# Patient Record
Sex: Male | Born: 1935 | Race: White | Hispanic: No | Marital: Married | Smoking: Former smoker
Health system: Southern US, Community
[De-identification: ages and names within clinical notes are randomized; demographics above are authoritative.]

## PROBLEM LIST (undated history)

## (undated) DIAGNOSIS — E119 Type 2 diabetes mellitus without complications: Secondary | ICD-10-CM

## (undated) DIAGNOSIS — M1990 Unspecified osteoarthritis, unspecified site: Secondary | ICD-10-CM

## (undated) DIAGNOSIS — I10 Essential (primary) hypertension: Secondary | ICD-10-CM

## (undated) DIAGNOSIS — Z8679 Personal history of other diseases of the circulatory system: Secondary | ICD-10-CM

## (undated) DIAGNOSIS — E785 Hyperlipidemia, unspecified: Secondary | ICD-10-CM

## (undated) DIAGNOSIS — J449 Chronic obstructive pulmonary disease, unspecified: Secondary | ICD-10-CM

## (undated) DIAGNOSIS — Z87438 Personal history of other diseases of male genital organs: Secondary | ICD-10-CM

## (undated) DIAGNOSIS — K760 Fatty (change of) liver, not elsewhere classified: Secondary | ICD-10-CM

## (undated) DIAGNOSIS — J309 Allergic rhinitis, unspecified: Secondary | ICD-10-CM

## (undated) DIAGNOSIS — K635 Polyp of colon: Secondary | ICD-10-CM

## (undated) DIAGNOSIS — I4510 Unspecified right bundle-branch block: Secondary | ICD-10-CM

## (undated) DIAGNOSIS — I2510 Atherosclerotic heart disease of native coronary artery without angina pectoris: Secondary | ICD-10-CM

## (undated) DIAGNOSIS — N200 Calculus of kidney: Secondary | ICD-10-CM

## (undated) HISTORY — DX: Essential (primary) hypertension: I10

## (undated) HISTORY — DX: Unspecified right bundle-branch block: I45.10

## (undated) HISTORY — PX: HEMICOLECTOMY: SHX854

## (undated) HISTORY — DX: Unspecified osteoarthritis, unspecified site: M19.90

## (undated) HISTORY — DX: Personal history of other diseases of the circulatory system: Z86.79

## (undated) HISTORY — DX: Personal history of other diseases of male genital organs: Z87.438

## (undated) HISTORY — DX: Fatty (change of) liver, not elsewhere classified: K76.0

## (undated) HISTORY — DX: Type 2 diabetes mellitus without complications: E11.9

## (undated) HISTORY — DX: Allergic rhinitis, unspecified: J30.9

## (undated) HISTORY — DX: Calculus of kidney: N20.0

## (undated) HISTORY — DX: Atherosclerotic heart disease of native coronary artery without angina pectoris: I25.10

## (undated) HISTORY — PX: COLONOSCOPY: SHX174

## (undated) HISTORY — DX: Chronic obstructive pulmonary disease, unspecified: J44.9

## (undated) HISTORY — DX: Polyp of colon: K63.5

## (undated) HISTORY — DX: Hyperlipidemia, unspecified: E78.5

---

## 2004-02-01 ENCOUNTER — Inpatient Hospital Stay (HOSPITAL_BASED_OUTPATIENT_CLINIC_OR_DEPARTMENT_OTHER): Admission: RE | Admit: 2004-02-01 | Discharge: 2004-02-01 | Payer: Self-pay | Admitting: Cardiology

## 2008-12-20 ENCOUNTER — Ambulatory Visit: Payer: Self-pay | Admitting: Vascular Surgery

## 2008-12-20 ENCOUNTER — Encounter: Payer: Self-pay | Admitting: Cardiology

## 2009-06-06 ENCOUNTER — Ambulatory Visit: Payer: Self-pay | Admitting: Vascular Surgery

## 2009-12-10 ENCOUNTER — Ambulatory Visit: Payer: Self-pay | Admitting: Vascular Surgery

## 2010-09-10 NOTE — Assessment & Plan Note (Signed)
OFFICE VISIT   Ryan [name]  DOB:  12/03/1932                                       12/20/2008  [id number]#:[id number]   The patient is a 77 year old male referred by Dr. Sherryll Burger for complaints of  bilateral lower extremity claudication.  He currently experiences a  tightening sensation in both calves after walking approximately 25-30  yards.  He also becomes short of breath at this point.  He denies any  history of chest pain.  He states symptoms have been fairly chronic but  slowly worse over the last 340 years.  He has not had any history of  nonhealing ulcers on the feet or rest pain.   Atherosclerotic risk factors include diabetes, hypertension and elevated  cholesterol.   PAST SURGICAL HISTORY:  None.   PAST MEDICAL HISTORY:  Enlarged liver.   SOCIAL HISTORY:  He is married.  He is a former two pack per day smoker,  but quit approximately 25 years ago.   REVIEW OF SYSTEMS:  CARDIAC:  He gets shortness of breath with exertion.  GENERAL:  Denies recent weight loss or gain.  GI:  Has a history of NASH.  RENAL:  He has some urinary frequency and history of kidney stones.  VASCULAR, NEURO, ORTHOPEDIC PSYCHIATRIC, ENT, HEMATOLOGIC, PULMONARY:  All negative.   MEDICATIONS:  1. Diltiazem ER 180 one daily.  2. Chlorthalidone 25 mg once daily.  3. Simvastatin 40 mg once daily.  4. Lisinopril 40 mg once daily.  5. Potassium chloride Micro ER 10 mEq once daily.  6. Atenolol 100 mg once a day.  7. Metformin 850 mg two daily.  8. Actos 15 mg once daily.  9. Aspirin 81 mg once daily.  10.Fish oil 1200 on mg once daily.  11.Cilostazol 100 mg which he just started.   PAST SURGICAL HISTORY:  None.   PHYSICAL EXAM:  Vital signs:  Blood pressure 172/70 in the left arm,  pulse is 73 and regular.  HEENT is unremarkable.  Neck:  Has 2+ carotid  pulses. Without bruit.  Chest:  Clear to auscultation.  Chest has  actually decreased breath sounds in the right lung  field.  Left chest is  clear to auscultation.  Heart:  Regular rate rhythm without murmur.  Abdomen:  Protuberant, soft, nontender, nondistended with palpable liver  approximately 3 cm below the right costal margin, no masses.  Extremities:  He has 2+ radial and femoral pulses bilaterally.  He has  3+ right popliteal pulse.  He has absent left popliteal pulse.  He has  2+ dorsalis pedis pulses bilaterally.  Feet are pink, warm and well-  perfused.   He had bilateral ABIs performed at [facility] on November 20, 2008.  This included ABIs with arterial duplex.  This showed calcified vessels  with occlusion of the posterior tibial artery and the left superficial  femoral artery.  He had bilateral ABIs performed at our office today  which showed biphasic flow in the dorsalis pedis artery bilaterally with  an ABI on the right side of 1.01, on the left of 0.75.   I had a lengthy discussion with the patient today concerning his  claudication symptoms.  He has relatively normal ABIs on the right side.  Although he does have some evidence on previous duplex of some tibial  artery occlusive disease, he  does have palpable pulses in his feet  bilaterally.  This should be adequate circulation.  His ABIs slightly  decreased on the left side and again this is probably due to superficial  femoral artery occlusive disease.  I believe the best management for  right now is Pletal as Dr. Sherryll Burger has already prescribed.  In addition, he  will walk for 30 minutes daily.  He also continue risk factor  modification of his diabetes, cholesterol and hypertension.   Additionally, since he had decreased breath sounds on the right side and  also has been developing shortness of breath at a very short walking  distance, I ordered a PA and lateral chest x-ray for him to evaluate his  dyspnea.  If this appears normal I may consider sending him back to Dr.  Sherryll Burger or Dr. Myrtis Ser for further evaluation of his dyspnea  symptoms.   The patient will follow up with me in six months' time for repeat ABIs.   Ryan Hora. Fields, MD  Electronically Signed   CEF/MEDQ  D:  12/20/2008  T:  12/21/2008  Job:  [id number]   cc:   Dr. Storm[facility]  Dr. Kirstie Peri

## 2010-09-13 NOTE — Cardiovascular Report (Signed)
NAMEJONUEL, BUTTERFIELD NO.:  [id number]   MEDICAL RECORD NO.:  [id number]          PATIENT TYPE:  OIB   LOCATION:  6501                         FACILITY:  [facility]   PHYSICIAN:  Rollene Rotunda, M.D.   DATE OF BIRTH:  03-15-1933   DATE OF PROCEDURE:  02/01/2004  DATE OF DISCHARGE:                              CARDIAC CATHETERIZATION   PRIMARY:  Dr. Wende Crease.   PROCEDURE:  Left heart catheterization/coronary arteriography.   INDICATION:  Evaluate patient with chest pain and a Cardiolite suggesting  possible lateral wall ischemia with a moderate defect in the inferior  inferolateral wall and a moderate anterior defect.   PROCEDURAL NOTE:  Left heart catheterization was performed via the right  femoral artery.  The artery was cannulated using an anterior wall puncture.  A #4-French arterial sheath was inserted via the modified Seldinger  technique.  Preformed Judkins and a pigtail catheter were utilized.  The  patient tolerated the procedure well and left the lab in stable condition.   RESULTS:   HEMODYNAMICS:  LV 179/32, AO 84/45.   CORONARIES:  The left main had 25% stenosis.   The LAD had a proximal 30% stenosis and mid long 25% stenosis.  There were  diffuse luminal irregularities.  The first diagonal was moderate-sized and  normal.  The second diagonal and third diagonal were small.   The circumflex in the AV groove had a 60% stenosis before a large obtuse  marginal.  I did inject intracoronary nitroglycerin and this did not appear  to be more severe than that after the injection of this.  There was a ramus  intermedius which was small-to-moderate-sized with ostial 30% to 40%  stenosis.  An OM-1 was large with luminal irregularities.  The OM-2 was  normal after the 60% lesion.   The right coronary artery is a large dominant vessel.  There were diffuse  25% lesions in the proximal and mid-body.   The PDA was moderate-sized with diffuse luminal  irregularities.  Posterolateral 1 and posterolateral 2 were small.   LEFT VENTRICULOGRAM:  The left ventriculogram was obtained in the RAO  projection.  The ER was 60% with well-preserved wall motion.   CONCLUSION:  Non-obstructive coronary disease.  Well-preserved ejection  fraction.   PLAN:  The patient will continue to have medical management and risk  reduction.  I have discussed this with Dr. Willa Rough, who will review the  films further and see the patient back in consultation.       JH/MEDQ  D:  02/01/2004  T:  02/01/2004  Job:  [id number]   cc:   Wende Crease M.D.   Jonita Albee, [facility]

## 2011-10-24 ENCOUNTER — Encounter: Payer: Self-pay | Admitting: Cardiology

## 2011-10-28 ENCOUNTER — Encounter: Payer: Self-pay | Admitting: Cardiology

## 2011-10-28 ENCOUNTER — Ambulatory Visit (INDEPENDENT_AMBULATORY_CARE_PROVIDER_SITE_OTHER): Payer: Medicare Other | Admitting: Cardiology

## 2011-10-28 VITALS — BP 154/77 | HR 79 | Ht 72.0 in | Wt 193.6 lb

## 2011-10-28 DIAGNOSIS — I313 Pericardial effusion (noninflammatory): Secondary | ICD-10-CM

## 2011-10-28 DIAGNOSIS — K760 Fatty (change of) liver, not elsewhere classified: Secondary | ICD-10-CM

## 2011-10-28 DIAGNOSIS — I358 Other nonrheumatic aortic valve disorders: Secondary | ICD-10-CM | POA: Insufficient documentation

## 2011-10-28 DIAGNOSIS — E119 Type 2 diabetes mellitus without complications: Secondary | ICD-10-CM

## 2011-10-28 DIAGNOSIS — I319 Disease of pericardium, unspecified: Secondary | ICD-10-CM

## 2011-10-28 DIAGNOSIS — I10 Essential (primary) hypertension: Secondary | ICD-10-CM | POA: Insufficient documentation

## 2011-10-28 DIAGNOSIS — R0989 Other specified symptoms and signs involving the circulatory and respiratory systems: Secondary | ICD-10-CM

## 2011-10-28 DIAGNOSIS — I359 Nonrheumatic aortic valve disorder, unspecified: Secondary | ICD-10-CM

## 2011-10-28 DIAGNOSIS — I3139 Other pericardial effusion (noninflammatory): Secondary | ICD-10-CM

## 2011-10-28 DIAGNOSIS — I340 Nonrheumatic mitral (valve) insufficiency: Secondary | ICD-10-CM

## 2011-10-28 DIAGNOSIS — IMO0002 Reserved for concepts with insufficient information to code with codable children: Secondary | ICD-10-CM

## 2011-10-28 DIAGNOSIS — J449 Chronic obstructive pulmonary disease, unspecified: Secondary | ICD-10-CM | POA: Insufficient documentation

## 2011-10-28 DIAGNOSIS — I509 Heart failure, unspecified: Secondary | ICD-10-CM | POA: Insufficient documentation

## 2011-10-28 DIAGNOSIS — K7689 Other specified diseases of liver: Secondary | ICD-10-CM

## 2011-10-28 DIAGNOSIS — E785 Hyperlipidemia, unspecified: Secondary | ICD-10-CM

## 2011-10-28 DIAGNOSIS — I4510 Unspecified right bundle-branch block: Secondary | ICD-10-CM

## 2011-10-28 DIAGNOSIS — R0602 Shortness of breath: Secondary | ICD-10-CM | POA: Insufficient documentation

## 2011-10-28 DIAGNOSIS — R9430 Abnormal result of cardiovascular function study, unspecified: Secondary | ICD-10-CM | POA: Insufficient documentation

## 2011-10-28 DIAGNOSIS — I059 Rheumatic mitral valve disease, unspecified: Secondary | ICD-10-CM

## 2011-10-28 DIAGNOSIS — I2510 Atherosclerotic heart disease of native coronary artery without angina pectoris: Secondary | ICD-10-CM

## 2011-10-28 MED ORDER — FUROSEMIDE 40 MG PO TABS: 40.0000 mg | ORAL_TABLET | Freq: Every day | ORAL | Status: DC

## 2011-10-28 MED ORDER — AMLODIPINE BESYLATE 5 MG PO TABS: 5.0000 mg | ORAL_TABLET | Freq: Every day | ORAL | Status: DC

## 2011-10-28 NOTE — Assessment & Plan Note (Signed)
The patient is on several medications for his blood pressure. It is slightly elevated today. As we change him from diltiazem to amlodipine we will probably get even better systolic blood pressure control.

## 2011-10-28 NOTE — Assessment & Plan Note (Signed)
In the past his ejection fraction was 60% as late as an echo in September, 2010. Ejection fraction was 45-50% by echo May, 2013. There were no definite focal wall motion abnormalities. Will have to rule out ischemic disease. Currently he is on an ACE inhibitor and a beta blocker. He is on diltiazem. This will be stopped and changed to amlodipine as these medicines are also used for his blood pressure. I will continue to titrate his medicines over time. I will probably change his atenolol to carvedilol at a later date.

## 2011-10-28 NOTE — Assessment & Plan Note (Signed)
At this point his shortness of breath is multifactorial. There seems to be a new additional component in the past several weeks. I suspect that volume is playing a role along with his underlying lung disease.

## 2011-10-28 NOTE — Assessment & Plan Note (Signed)
There is mild mitral regurgitation by history. This will be followed.

## 2011-10-28 NOTE — Assessment & Plan Note (Addendum)
The patient appears to have some CHF. He has had pleural effusions in the past and his most recent chest x-ray did show pleural fluid. Since that time there has been some diuresis. I will repeat chest x-ray for new baseline as I put him on Lasix 40 every day. I have discussed with him beginning to limit his salt intake and his total overall fluid intake. He and his wife seem to understand. I am also obtaining recent chemistry labs from his primary physician. The overall plan is to change his diltiazem to amlodipine. He will start to take Lasix 40 every day. I will review his chemistry labs and decide the timing of his next chemistries. He will continue his daily weights and watch his salt and fluid intake. Chest x-ray will be done again for further assessment. I will then see him in followup. Decisions will be made later about working up possible ischemic etiology of his decreased LV function.   The patient had a chest x-ray after leaving the office today. There is a large left pleural effusion. I have increased his diuretics, I will watch his response and see him back next week. We will then decide if this effusion needs to be tapped or if it is decreasing clinically.

## 2011-10-28 NOTE — Assessment & Plan Note (Addendum)
There is known mild coronary artery disease. This was proven by catheterization in 2005. At this point I do not have any definite proof that coronary disease has caused a worsening of his LV function. However over time this will have to be assessed. EKG today reveals right bundle branch block. This is unchanged from December, 2012. There are very small lateral Q waves. This does not appear to be changed. When I see him back for the next visit we will consider proceeding with further evaluation to rule out ischemic disease.

## 2011-10-28 NOTE — Assessment & Plan Note (Signed)
The patient's pulmonary function studies in 2009 revealed severe lung disease. He is on pulmonary medications. At some point we will have to see if his primary physician wants to consider further evaluation by pulmonary

## 2011-10-28 NOTE — Patient Instructions (Addendum)
Your physician recommends that you schedule a follow-up appointment in: 3 weeks with Dr. Myrtis Ser.  Your physician has recommended you make the following change in your medication:  Stop diltiazem & start taking amlodipine. Take furosemide 40 mg daily Your new prescriptions have been sent to your pharmacy.  Your physician recommends that you weigh, daily, at the same time every day, and in the same amount of clothing. Please record your daily weights and bring it to your next appointment.  Your physician recommends that you have a chest x-ray today at [facility]. A chest x-ray takes a picture of the organs and structures inside the chest, including the heart, lungs, and blood vessels. This test can show several things, including, whether the heart is enlarges; whether fluid is building up in the lungs; and whether pacemaker / defibrillator leads are still in place.

## 2011-10-28 NOTE — Progress Notes (Signed)
HPI  The patient is seen today as a new patient evaluation in consultation for the evaluation of shortness of breath. The patient has been seen by our team in the remote past. I cannot find all of the records related to this. However I have located his catheterization report  And from October, 2005. He had nonobstructive disease. The ejection fraction was 60%. He had scattered 20 and 30% lesions.  As part of my evaluation today I have spent greater than one hour at a gathering prior records and reviewing them. I have reviewed the office records from the patient's primary care office. I have reviewed echo reports and EKGs and CT scans.  In the past the patient's ejection fraction was 60%. He had an echo done in his primary care office showing an ejection fraction of 45-50% in May, 2013. There were no definite focal wall motion abnormalities. There was mild mitral regurgitation. There was a small pericardial effusion.  The patient's symptoms are shortness of breath. It is known from prior pulmonary function studies that he actually has severe restrictive and obstructive disease and decreased diffusion capacity. However with this as a baseline he's done relatively well. It is only in the past several months these had increased shortness of breath. He does have PND and orthopnea. He has been placed on a diuretic. He describes diuresis in from this. The diuretic dose was then cut back. He is keeping a good weight chart at home. His weight had been 191 pounds and most recently is down to 188 pounds.  Patient also has peripheral vascular disease. I have reviewed the note from Dr. Darrick Penna in 2010 discussing disease with a decreased ABI in one of his legs.  Allergies  Allergen Reactions  . Norvasc (Amlodipine Besylate)     Ankle edema     Current Outpatient Prescriptions  Medication Sig Dispense Refill  . aspirin 81 MG tablet Take 81 mg by mouth every evening.      Marland Kitchen atenolol (TENORMIN) 100 MG tablet  Take 50 mg by mouth every morning.       . diltiazem (TIAZAC) 180 MG 24 hr capsule Take 180 mg by mouth daily.      . Fluticasone-Salmeterol (ADVAIR) 250-50 MCG/DOSE AEPB Inhale 1 puff into the lungs every morning.      . furosemide (LASIX) 40 MG tablet Take 20 mg by mouth as needed.      Marland Kitchen lisinopril (PRINIVIL,ZESTRIL) 40 MG tablet Take 60 mg by mouth every morning.       . potassium chloride (KLOR-CON 10) 10 MEQ tablet Take 10 mEq by mouth 2 (two) times daily.      . simvastatin (ZOCOR) 40 MG tablet Take 40 mg by mouth every evening.      . Tamsulosin HCl (FLOMAX) 0.4 MG CAPS Take 0.4 mg by mouth every morning.         History   Social History  . Marital Status: Married    Spouse Name: N/A    Number of Children: 2  . Years of Education: N/A   Occupational History  . retired    Social History Main Topics  . Smoking status: Former Smoker    Quit date: 10/23/1981  . Smokeless tobacco: Not on file  . Alcohol Use: No  . Drug Use: Not on file  . Sexually Active: Not on file   Other Topics Concern  . Not on file   Social History Narrative  . No narrative on file  No family history on file.  Past Medical History  Diagnosis Date  . CAD (coronary artery disease)     Nonobstructive CAD, catheterization, [place],  October, 2005  . Ejection fraction     EF 45-50%, echo, May, 2013  . Colon polyp     Adenomatous colon polyp  . H/O left hemicolectomy   . Calculus of kidney   . Mitral regurgitation     Mild, echo, May, 2013  . Osteoarthrosis, unspecified whether generalized or localized, unspecified site   . Pericardial effusion     Small, echo, May, 2013  . Retention of urine, unspecified   . Fatty liver     Nonalcoholic fatty liver  . Aortic valve sclerosis     Sclerosis, but no stenosis, echo, make, 2013  . Hypertension   . DM (diabetes mellitus)   . Allergic rhinitis, cause unspecified   . Edema     History of edema  . Dyslipidemia   . Chronic maxillary  sinusitis   . Prostatitis, unspecified   . COPD (chronic obstructive pulmonary disease)     Pulmonary function tests, 2009, severe obstructive airway disease and severe restrictive disease and severe diffusion defect  . Shortness of breath   . Dysuria     Past Surgical History  Procedure Date  . Cardaic catheterization   . Colonoscopy   . S/p hemicolectmy     ROS   Patient denies fever, chills, headache, sweats, rash, change in vision, change in hearing, chest pain, nausea vomiting, urinary symptoms. All other systems are reviewed and are negative.  PHYSICAL EXAM  Patient is oriented to person time and place. Affect is normal. He is here with his wife. There is no jugular venous distention. There are no obvious carotid bruits. Lungs reveal diminished breath sounds. Cardiac exam reveals S1 and S2. There no clicks or significant murmurs. The abdomen is soft. There is trace peripheral edema. There are no musculoskeletal deformities. There are no skin rashes.  Filed Vitals:   10/28/11 0914  BP: 154/77  Pulse: 79  Height: 6' (1.829 m)  Weight: 193 lb 9.6 oz (87.816 kg)  SpO2: 93%    EKG is done today and reviewed by me. I have also compared with an EKG of December, 2012.  ASSESSMENT & PLAN

## 2011-10-28 NOTE — Assessment & Plan Note (Signed)
The patient had a small pericardial effusion by echo in May, 2013. He does not need a repeat echo at this time.

## 2011-11-04 ENCOUNTER — Ambulatory Visit (INDEPENDENT_AMBULATORY_CARE_PROVIDER_SITE_OTHER): Payer: Medicare Other | Admitting: Cardiology

## 2011-11-04 ENCOUNTER — Encounter: Payer: Self-pay | Admitting: Cardiology

## 2011-11-04 VITALS — BP 117/76 | HR 61 | Ht 72.0 in | Wt 190.0 lb

## 2011-11-04 DIAGNOSIS — R0989 Other specified symptoms and signs involving the circulatory and respiratory systems: Secondary | ICD-10-CM

## 2011-11-04 DIAGNOSIS — R9430 Abnormal result of cardiovascular function study, unspecified: Secondary | ICD-10-CM

## 2011-11-04 DIAGNOSIS — I509 Heart failure, unspecified: Secondary | ICD-10-CM

## 2011-11-04 DIAGNOSIS — J4489 Other specified chronic obstructive pulmonary disease: Secondary | ICD-10-CM

## 2011-11-04 DIAGNOSIS — I319 Disease of pericardium, unspecified: Secondary | ICD-10-CM

## 2011-11-04 DIAGNOSIS — I10 Essential (primary) hypertension: Secondary | ICD-10-CM

## 2011-11-04 DIAGNOSIS — I313 Pericardial effusion (noninflammatory): Secondary | ICD-10-CM

## 2011-11-04 DIAGNOSIS — I2510 Atherosclerotic heart disease of native coronary artery without angina pectoris: Secondary | ICD-10-CM

## 2011-11-04 DIAGNOSIS — J449 Chronic obstructive pulmonary disease, unspecified: Secondary | ICD-10-CM

## 2011-11-04 MED ORDER — FUROSEMIDE 40 MG PO TABS: 40.0000 mg | ORAL_TABLET | Freq: Two times a day (BID) | ORAL | Status: DC

## 2011-11-04 NOTE — Assessment & Plan Note (Signed)
We know the patient has mild coronary disease from his cath in 2005. Over time I will decide when we will proceed with further ischemic workup.

## 2011-11-04 NOTE — Progress Notes (Signed)
HPI   The patient is seen back today to further assess his volume status. I saw him on October 28, 2011. The patient underwent catheterization in 2005 with nonobstructive disease. He did have 30 and 20% lesions. His ejection fraction was 60%. He had an echo in May, 2013 with his ejection fraction in the 45-50% range. There were no definite focal wall motion abnormalities. There was a small pericardial effusion. He had increasing shortness of breath. He has significant pulmonary disease. However I did feel that he was wet. He had a chest x-ray that day that showed a significant pleural effusion. I put him on Lasix 40 mg daily. He has diuresis 3 pounds. He is feeling better but he still cannot lie down at night time.  Allergies  Allergen Reactions  . Norvasc (Amlodipine Besylate)     Ankle edema     Current Outpatient Prescriptions  Medication Sig Dispense Refill  . amLODipine (NORVASC) 5 MG tablet Take 1 tablet (5 mg total) by mouth daily.  30 tablet  3  . aspirin 81 MG tablet Take 81 mg by mouth every evening.      Marland Kitchen atenolol (TENORMIN) 100 MG tablet Take 50 mg by mouth every morning.       . Fluticasone-Salmeterol (ADVAIR) 250-50 MCG/DOSE AEPB Inhale 1 puff into the lungs every morning.      . furosemide (LASIX) 40 MG tablet Take 1 tablet (40 mg total) by mouth daily.  30 tablet  3  . lisinopril (PRINIVIL,ZESTRIL) 40 MG tablet Take 60 mg by mouth every morning.       . potassium chloride (KLOR-CON 10) 10 MEQ tablet Take 10 mEq by mouth 2 (two) times daily.      . simvastatin (ZOCOR) 40 MG tablet Take 40 mg by mouth every evening.      . Tamsulosin HCl (FLOMAX) 0.4 MG CAPS Take 0.4 mg by mouth every morning.         History   Social History  . Marital Status: Married    Spouse Name: N/A    Number of Children: 2  . Years of Education: N/A   Occupational History  . retired    Social History Main Topics  . Smoking status: Former Smoker    Quit date: 10/23/1981  . Smokeless tobacco:  Not on file  . Alcohol Use: No  . Drug Use: Not on file  . Sexually Active: Not on file   Other Topics Concern  . Not on file   Social History Narrative  . No narrative on file    No family history on file.  Past Medical History  Diagnosis Date  . CAD (coronary artery disease)     Nonobstructive CAD, catheterization, [place],  October, 2005  . Ejection fraction     EF 45-50%, echo, May, 2013  . Colon polyp     Adenomatous colon polyp  . H/O left hemicolectomy   . Calculus of kidney   . Mitral regurgitation     Mild, echo, May, 2013  . Osteoarthrosis, unspecified whether generalized or localized, unspecified site   . Pericardial effusion     Small, echo, May, 2013  . Retention of urine, unspecified   . Fatty liver     Nonalcoholic fatty liver  . Aortic valve sclerosis     Sclerosis, but no stenosis, echo, make, 2013  . Hypertension   . DM (diabetes mellitus)   . Allergic rhinitis, cause unspecified   . Edema  History of edema  . Dyslipidemia   . Chronic maxillary sinusitis   . Prostatitis, unspecified   . COPD (chronic obstructive pulmonary disease)     Pulmonary function tests, 2009, severe obstructive airway disease and severe restrictive disease and severe diffusion defect  . Shortness of breath   . Dysuria   . CHF (congestive heart failure)     Mixed systolic and diastolic CHF May, 2013, chest x-ray with bilateral pleural effusions may they 6, 2013  . Right bundle branch block     Right bundle-branch block noted as early as December, 2012.    Past Surgical History  Procedure Date  . Cardaic catheterization   . Colonoscopy   . S/p hemicolectmy     ROS  Patient denies fever, chills, headache, sweats, rash, change in vision, change in hearing, chest pain, cough, nausea vomiting, urinary symptoms. All other systems are reviewed and are negative.  PHYSICAL EXAM   Patient is oriented person time and place. Affect is normal. He's here with his wife. He  has decreased breath sounds. There is no jugulovenous distention. Lungs reveal decreased breath sounds as mentioned. Cardiac exam reveals S1 and S2. There no clicks or significant murmurs. The abdomen is soft. He still has 1+ peripheral edema. There no musculoskeletal deformities. There are no skin rashes.  Filed Vitals:   11/04/11 1321  BP: 117/76  Pulse: 61  Height: 6' (1.829 m)  Weight: 190 lb (86.183 kg)  SpO2: 96%     ASSESSMENT & PLAN

## 2011-11-04 NOTE — Assessment & Plan Note (Signed)
He had a small pericardial effusion on his echo in May, 2013. This will be followed.

## 2011-11-04 NOTE — Assessment & Plan Note (Signed)
His CHF is playing a role with the shortness of breath. He does have a significant pleural effusion by chest x-ray. On October 28, 2011. Chemistry will be checked today. All increase his furosemide from 40 daily to 40 twice a day. I will see him back for early followup. He is watching his salt intake and total fluid intake.

## 2011-11-04 NOTE — Assessment & Plan Note (Signed)
I have been able to switch him from diltiazem to amlodipine. I will not change his meds further today other than increase his diuretics. Over time we'll follow his LV function and make further decisions. A later date I will change his atenolol to carvedilol. He is on ACE inhibitor.

## 2011-11-04 NOTE — Assessment & Plan Note (Signed)
Blood pressure is nicely controlled now on the current medications with him being switched from diltiazem to amlodipine. No change in therapy.

## 2011-11-04 NOTE — Assessment & Plan Note (Signed)
The patient has severe pulmonary disease by history. I do not know if he needs further pulmonary evaluation at this time.

## 2011-11-04 NOTE — Patient Instructions (Addendum)
Your physician recommends that you schedule a follow-up appointment in: July 18th 2013 with Dr. Myrtis Ser. Your physician has recommended you make the following change in your medication: increase furosemide 40 mg to twice daily. Your physician recommends that you return for lab work in: today at [facility] for [employer].

## 2011-11-07 ENCOUNTER — Telehealth: Payer: Self-pay | Admitting: *Deleted

## 2011-11-07 NOTE — Telephone Encounter (Signed)
Patient informed via message machine. 

## 2011-11-07 NOTE — Telephone Encounter (Signed)
Message copied by Eustace Moore on Fri Nov 07, 2011  1:07 PM ------      Message from: Ryan [name], [place] D      Created: Fri Nov 07, 2011 11:36 AM       Please let him know that his lab test was good. No change in therapy

## 2011-11-11 ENCOUNTER — Ambulatory Visit: Payer: Medicare Other | Admitting: Cardiology

## 2011-11-13 ENCOUNTER — Encounter: Payer: Self-pay | Admitting: Cardiology

## 2011-11-13 ENCOUNTER — Ambulatory Visit (INDEPENDENT_AMBULATORY_CARE_PROVIDER_SITE_OTHER): Payer: Medicare Other | Admitting: Cardiology

## 2011-11-13 VITALS — BP 115/61 | HR 65 | Ht 72.0 in | Wt 185.1 lb

## 2011-11-13 DIAGNOSIS — I10 Essential (primary) hypertension: Secondary | ICD-10-CM

## 2011-11-13 DIAGNOSIS — I5042 Chronic combined systolic (congestive) and diastolic (congestive) heart failure: Secondary | ICD-10-CM

## 2011-11-13 DIAGNOSIS — I313 Pericardial effusion (noninflammatory): Secondary | ICD-10-CM

## 2011-11-13 DIAGNOSIS — J449 Chronic obstructive pulmonary disease, unspecified: Secondary | ICD-10-CM

## 2011-11-13 DIAGNOSIS — I509 Heart failure, unspecified: Secondary | ICD-10-CM

## 2011-11-13 DIAGNOSIS — I2510 Atherosclerotic heart disease of native coronary artery without angina pectoris: Secondary | ICD-10-CM

## 2011-11-13 DIAGNOSIS — R9430 Abnormal result of cardiovascular function study, unspecified: Secondary | ICD-10-CM

## 2011-11-13 DIAGNOSIS — I319 Disease of pericardium, unspecified: Secondary | ICD-10-CM

## 2011-11-13 DIAGNOSIS — R0602 Shortness of breath: Secondary | ICD-10-CM

## 2011-11-13 DIAGNOSIS — R490 Dysphonia: Secondary | ICD-10-CM | POA: Insufficient documentation

## 2011-11-13 DIAGNOSIS — R0989 Other specified symptoms and signs involving the circulatory and respiratory systems: Secondary | ICD-10-CM

## 2011-11-13 MED ORDER — LOSARTAN POTASSIUM 100 MG PO TABS: 100.0000 mg | ORAL_TABLET | Freq: Every day | ORAL | Status: DC

## 2011-11-13 NOTE — Assessment & Plan Note (Signed)
Coronary disease is stable. I will decide at a later date the studies that we'll be done next

## 2011-11-13 NOTE — Assessment & Plan Note (Signed)
His breathing is much better. I don't expect it to be normal because of his lung disease.

## 2011-11-13 NOTE — Assessment & Plan Note (Addendum)
The patient is receiving medications for decreased LV function. We will decide about a followup echo when his meds are completely adjusted. Also, I will decide about changing his atenolol to carvedilol at a later date.

## 2011-11-13 NOTE — Assessment & Plan Note (Signed)
I am wondering if this could be related to his ACE inhibitor. I will change him to Cozaar.

## 2011-11-13 NOTE — Assessment & Plan Note (Signed)
Patient is diuresing very nicely. Followup chest x-ray will now be arranged to see if his pleural effusions are gone.

## 2011-11-13 NOTE — Patient Instructions (Addendum)
Your physician recommends that you schedule a follow-up appointment in: 6-7 weeks with Dr. Myrtis Ser. Your physician has recommended you make the following change in your medication: STOP LISINOPRIL AND START LOSARTAN 100 MG DAILY. Your new prescription has been sent to your pharmacy. Your physician recommends that you return for lab work in: today at [facility]. A chest x-ray takes a picture of the organs and structures inside the chest, including the heart, lungs, and blood vessels. This test can show several things, including, whether the heart is enlarges; whether fluid is building up in the lungs; and whether pacemaker / defibrillator leads are still in place. Please have your chest x-ray today at [facility].

## 2011-11-13 NOTE — Assessment & Plan Note (Signed)
We know that the patient has significant lung disease.

## 2011-11-13 NOTE — Assessment & Plan Note (Signed)
Followup echo will be done when his meds are completely adjusted. I suspect his pericardial effusion will be gone.

## 2011-11-13 NOTE — Assessment & Plan Note (Signed)
Blood pressure stable with his current diuresis.

## 2011-11-13 NOTE — Progress Notes (Signed)
HPI  The patient is seen back for followup fluid overload. I saw him on July 2 to July 9. On the current dose of Lasix to 40 twice a day he has continued to diuresis. His weight is down today 185 Pounds. Prior office weight was 190. He definitely is feeling better. He is walking better without significant shortness of breath. He does have some weakness probably related to diuresis. His labs have been checked on July 9 with part of his diuresis  And his BUN was stable at 26 and creatinine 1.03. Potassium was 4.6.  The patient mentions a new issue today. He says that he has some hoarseness in his throat. He does not describe  an obvious regular dry cough, But I wonder if his symptoms are related to his ACE inhibitor. Allergies  Allergen Reactions  . Norvasc (Amlodipine Besylate)     Ankle edema     Current Outpatient Prescriptions  Medication Sig Dispense Refill  . amLODipine (NORVASC) 5 MG tablet Take 1 tablet (5 mg total) by mouth daily.  30 tablet  3  . aspirin 81 MG tablet Take 81 mg by mouth every evening.      Marland Kitchen atenolol (TENORMIN) 100 MG tablet Take 50 mg by mouth every morning.       . Fluticasone-Salmeterol (ADVAIR) 250-50 MCG/DOSE AEPB Inhale 1 puff into the lungs every morning.      . furosemide (LASIX) 40 MG tablet Take 1 tablet (40 mg total) by mouth 2 (two) times daily.  60 tablet  3  . lisinopril (PRINIVIL,ZESTRIL) 40 MG tablet Take 60 mg by mouth every morning.       . potassium chloride (KLOR-CON 10) 10 MEQ tablet Take 10 mEq by mouth 2 (two) times daily.      . simvastatin (ZOCOR) 40 MG tablet Take 40 mg by mouth every evening.      . Tamsulosin HCl (FLOMAX) 0.4 MG CAPS Take 0.4 mg by mouth every morning.         History   Social History  . Marital Status: Married    Spouse Name: N/A    Number of Children: 2  . Years of Education: N/A   Occupational History  . retired    Social History Main Topics  . Smoking status: Former Smoker    Quit date: 10/23/1981  .  Smokeless tobacco: Not on file  . Alcohol Use: No  . Drug Use: Not on file  . Sexually Active: Not on file   Other Topics Concern  . Not on file   Social History Narrative  . No narrative on file    No family history on file.  Past Medical History  Diagnosis Date  . CAD (coronary artery disease)     Nonobstructive CAD, catheterization, [place],  October, 2005  . Ejection fraction     EF 45-50%, echo, May, 2013  . Colon polyp     Adenomatous colon polyp  . H/O left hemicolectomy   . Calculus of kidney   . Mitral regurgitation     Mild, echo, May, 2013  . Osteoarthrosis, unspecified whether generalized or localized, unspecified site   . Pericardial effusion     Small, echo, May, 2013  . Retention of urine, unspecified   . Fatty liver     Nonalcoholic fatty liver  . Aortic valve sclerosis     Sclerosis, but no stenosis, echo, make, 2013  . Hypertension   . DM (diabetes mellitus)   .  Allergic rhinitis, cause unspecified   . Edema     History of edema  . Dyslipidemia   . Chronic maxillary sinusitis   . Prostatitis, unspecified   . COPD (chronic obstructive pulmonary disease)     Pulmonary function tests, 2009, severe obstructive airway disease and severe restrictive disease and severe diffusion defect  . Shortness of breath   . Dysuria   . CHF (congestive heart failure)     Mixed systolic and diastolic CHF May, 2013, chest x-ray with bilateral pleural effusions may they 6, 2013  . Right bundle branch block     Right bundle-branch block noted as early as December, 2012.    Past Surgical History  Procedure Date  . Cardaic catheterization   . Colonoscopy   . S/p hemicolectmy     ROS   Patient denies fever, chills, headache, sweats, rash, change in vision, change in hearing, chest pain, nausea vomiting, urinary symptoms. All other systems are reviewed and are negative.  PHYSICAL EXAM   Patient is oriented to person time and place. Affect is normal. He is here  with his wife. There is no jugulovenous distention. Lung exam today reveals better breath sounds. I do not hear any definite rales. Cardiac exam reveals S1 and S2. There no clicks or significant murmurs. The abdomen is soft. Is no peripheral edema. There are no musculoskeletal deformities. There are no skin rashes.  Filed Vitals:   11/13/11 1257  BP: 115/61  Pulse: 65  Height: 6' (1.829 m)  Weight: 185 lb 1.9 oz (83.97 kg)  SpO2: 92%     ASSESSMENT & PLAN

## 2011-12-24 ENCOUNTER — Ambulatory Visit: Payer: Medicare Other | Admitting: Cardiology

## 2012-01-12 ENCOUNTER — Ambulatory Visit (INDEPENDENT_AMBULATORY_CARE_PROVIDER_SITE_OTHER): Payer: Medicare Other | Admitting: Cardiology

## 2012-01-12 ENCOUNTER — Encounter: Payer: Self-pay | Admitting: Cardiology

## 2012-01-12 VITALS — BP 115/67 | HR 55 | Ht 72.0 in | Wt 190.1 lb

## 2012-01-12 DIAGNOSIS — R490 Dysphonia: Secondary | ICD-10-CM

## 2012-01-12 DIAGNOSIS — I2510 Atherosclerotic heart disease of native coronary artery without angina pectoris: Secondary | ICD-10-CM

## 2012-01-12 DIAGNOSIS — I319 Disease of pericardium, unspecified: Secondary | ICD-10-CM

## 2012-01-12 DIAGNOSIS — I313 Pericardial effusion (noninflammatory): Secondary | ICD-10-CM

## 2012-01-12 DIAGNOSIS — R9430 Abnormal result of cardiovascular function study, unspecified: Secondary | ICD-10-CM

## 2012-01-12 DIAGNOSIS — I10 Essential (primary) hypertension: Secondary | ICD-10-CM

## 2012-01-12 DIAGNOSIS — R0989 Other specified symptoms and signs involving the circulatory and respiratory systems: Secondary | ICD-10-CM

## 2012-01-12 DIAGNOSIS — J90 Pleural effusion, not elsewhere classified: Secondary | ICD-10-CM

## 2012-01-12 DIAGNOSIS — J4489 Other specified chronic obstructive pulmonary disease: Secondary | ICD-10-CM

## 2012-01-12 DIAGNOSIS — J449 Chronic obstructive pulmonary disease, unspecified: Secondary | ICD-10-CM

## 2012-01-12 NOTE — Patient Instructions (Signed)
   Chest x-ray - TODAY  Office will contact with results Continue all current medications. Follow up in  6 weeks

## 2012-01-12 NOTE — Assessment & Plan Note (Signed)
Blood pressure is controlled today. No change in therapy. 

## 2012-01-12 NOTE — Assessment & Plan Note (Signed)
His hoarseness appeared to improve when he was switched from an ACE inhibitor to an ARB. The ARB will be continued at this time.

## 2012-01-12 NOTE — Assessment & Plan Note (Signed)
The patient had mild coronary disease in the past. Over time we will eventually proceed with an ischemic workup. He is not having any significant symptoms at this time.

## 2012-01-12 NOTE — Assessment & Plan Note (Signed)
The patient has persistent pleural effusions. I had thought that this was possibly related to CHF. Because there has been persistence there will be further evaluation. He will have a 2 view chest x-ray in the near future. Based on that result I will make further decisions about the approach to his care. It is possible we may need to tap one of these effusions. Another possibility would be to push his diuretics more vigorously.

## 2012-01-12 NOTE — Assessment & Plan Note (Signed)
The patient had a small pericardial effusion in May, 2013. After I have his chest x-ray I will decide about the followup of his echo.

## 2012-01-12 NOTE — Assessment & Plan Note (Signed)
The patient has severe lung disease. This is kept in mind.

## 2012-01-12 NOTE — Assessment & Plan Note (Signed)
The patient does have mild decrease in LV function. There can be further titration of his medications. I will make this decision after his next chest x-ray.

## 2012-01-12 NOTE — Progress Notes (Signed)
HPI  Patient is seen today for followup his fluid status. In early July he was fluid overloaded. Lasix was used and he diuresis. He definitely is feeling better. He was feeling better on November 13, 2011 when I saw him last. He continues to feel well. However the chest x-ray done after I saw him in July continued to show pleural effusions. The left was greater than the right.  He had some hoarseness. I changed his ACE inhibitor to an ARB and this is improved. We know that his ejection fraction was 45-50% in May, 2013. This was somewhat decreased from the past. He has not had any chest pain or not not done an ischemic evaluation. I have not yet changed his beta blocker to carvedilol.  No Active Allergies  Current Outpatient Prescriptions  Medication Sig Dispense Refill  . amLODipine (NORVASC) 5 MG tablet Take 1 tablet (5 mg total) by mouth daily.  30 tablet  3  . aspirin 81 MG tablet Take 81 mg by mouth every evening.      Marland Kitchen atenolol (TENORMIN) 100 MG tablet Take 50 mg by mouth every morning.       . Fluticasone-Salmeterol (ADVAIR) 250-50 MCG/DOSE AEPB Inhale 1 puff into the lungs every morning.      . furosemide (LASIX) 40 MG tablet Take 1 tablet (40 mg total) by mouth 2 (two) times daily.  60 tablet  3  . losartan (COZAAR) 100 MG tablet Take 1 tablet (100 mg total) by mouth daily.  30 tablet  2  . potassium chloride (KLOR-CON 10) 10 MEQ tablet Take 10 mEq by mouth 2 (two) times daily.      . simvastatin (ZOCOR) 40 MG tablet Take 40 mg by mouth every evening.      . Tamsulosin HCl (FLOMAX) 0.4 MG CAPS Take 0.4 mg by mouth every morning.         History   Social History  . Marital Status: Married    Spouse Name: N/A    Number of Children: 2  . Years of Education: N/A   Occupational History  . retired    Social History Main Topics  . Smoking status: Former Smoker    Quit date: 10/23/1981  . Smokeless tobacco: Not on file  . Alcohol Use: No  . Drug Use: Not on file  . Sexually  Active: Not on file   Other Topics Concern  . Not on file   Social History Narrative  . No narrative on file    No family history on file.  Past Medical History  Diagnosis Date  . CAD (coronary artery disease)     Nonobstructive CAD, catheterization, [place],  October, 2005  . Ejection fraction     EF 45-50%, echo, May, 2013  . Colon polyp     Adenomatous colon polyp  . H/O left hemicolectomy   . Calculus of kidney   . Mitral regurgitation     Mild, echo, May, 2013  . Osteoarthrosis, unspecified whether generalized or localized, unspecified site   . Pericardial effusion     Small, echo, May, 2013  . Retention of urine, unspecified   . Fatty liver     Nonalcoholic fatty liver  . Aortic valve sclerosis     Sclerosis, but no stenosis, echo, make, 2013  . Hypertension   . DM (diabetes mellitus)   . Allergic rhinitis, cause unspecified   . Edema     History of edema  . Dyslipidemia   .  Chronic maxillary sinusitis   . Prostatitis, unspecified   . COPD (chronic obstructive pulmonary disease)     Pulmonary function tests, 2009, severe obstructive airway disease and severe restrictive disease and severe diffusion defect  . Shortness of breath   . Dysuria   . CHF (congestive heart failure)     Mixed systolic and diastolic CHF May, 2013, chest x-ray with bilateral pleural effusions may they 6, 2013  . Right bundle branch block     Right bundle-branch block noted as early as December, 2012.  Marland Kitchen Hoarseness     Possible dry cough  and possible course this with for his change July, 2013    Past Surgical History  Procedure Date  . Cardaic catheterization   . Colonoscopy   . S/p hemicolectmy     ROS   Patient denies fever, chills, headache, sweats, rash, change in vision, change in hearing, chest pain, cough, nausea vomiting, urinary symptoms. All other systems are reviewed and are negative.  PHYSICAL EXAM  Patient is oriented to person time and place. Affect is normal.  His weight has gone back up to 190 pounds. He thinks that this is true body weight and not extra fluid. There is no jugulovenous distention. Lungs reveal decreased breath sounds at the left base. No definite rales are heard. Cardiac exam reveals S1 and S2. There no clicks or significant murmurs. The abdomen is soft. There is no significant peripheral edema. There no musculoskeletal deformities. There are no skin rashes.  Filed Vitals:   01/12/12 1008  BP: 115/67  Pulse: 55  Height: 6' (1.829 m)  Weight: 190 lb 1.9 oz (86.238 kg)  SpO2: 96%     ASSESSMENT & PLAN

## 2012-01-23 ENCOUNTER — Telehealth: Payer: Self-pay | Admitting: *Deleted

## 2012-01-23 NOTE — Telephone Encounter (Signed)
Notes Recorded by Lesle Chris, LPN on 1/61/0960 at 9:40 AM Patient notified.  Already has follow up OV scheduled for 10/29 with Dr. Myrtis Ser.

## 2012-01-23 NOTE — Telephone Encounter (Signed)
Message copied by Lesle Chris on Fri Jan 23, 2012  9:41 AM ------      Message from: [place], [place] D      Created: Fri Jan 23, 2012  8:26 AM       Please let him know that x-ray shows some decrease in the fluid in his left lung. We will continue to follow this for now. I'll see him in the office in followup

## 2012-01-30 ENCOUNTER — Ambulatory Visit: Payer: Medicare Other | Admitting: Cardiology

## 2012-02-14 ENCOUNTER — Other Ambulatory Visit: Payer: Self-pay | Admitting: Cardiology

## 2012-02-24 ENCOUNTER — Encounter: Payer: Self-pay | Admitting: Cardiology

## 2012-02-24 ENCOUNTER — Ambulatory Visit (INDEPENDENT_AMBULATORY_CARE_PROVIDER_SITE_OTHER): Payer: Medicare Other | Admitting: Cardiology

## 2012-02-24 VITALS — BP 129/68 | HR 52 | Ht 72.0 in | Wt 199.1 lb

## 2012-02-24 DIAGNOSIS — I313 Pericardial effusion (noninflammatory): Secondary | ICD-10-CM

## 2012-02-24 DIAGNOSIS — R0989 Other specified symptoms and signs involving the circulatory and respiratory systems: Secondary | ICD-10-CM

## 2012-02-24 DIAGNOSIS — I10 Essential (primary) hypertension: Secondary | ICD-10-CM

## 2012-02-24 DIAGNOSIS — I2510 Atherosclerotic heart disease of native coronary artery without angina pectoris: Secondary | ICD-10-CM

## 2012-02-24 DIAGNOSIS — I319 Disease of pericardium, unspecified: Secondary | ICD-10-CM

## 2012-02-24 DIAGNOSIS — J90 Pleural effusion, not elsewhere classified: Secondary | ICD-10-CM

## 2012-02-24 DIAGNOSIS — R9430 Abnormal result of cardiovascular function study, unspecified: Secondary | ICD-10-CM

## 2012-02-24 NOTE — Assessment & Plan Note (Signed)
Blood pressure is controlled. No change in therapy. 

## 2012-02-24 NOTE — Assessment & Plan Note (Signed)
I have once again considered whether or not I will change his medications or to follow up echo. I have chosen again to continue to follow him clinically.

## 2012-02-24 NOTE — Assessment & Plan Note (Signed)
I believe that his coronary status is stable. He had only mild disease by catheterization in 2005. There was question in this year of some decrease in ejection fraction. I have so chosen not to proceed with any type of ischemic workup. I will rereview this when I see him back.

## 2012-02-24 NOTE — Progress Notes (Signed)
HPI  Patient is seen today to followup his volume status. Early in 2013 the patient had a hemicolectomy. He was weak and ill for a period of time. In July he was volume overloaded. He had an echo in May showing ejection fraction in the 45-50% range with mild mitral regurgitation. With volume overload he did diaries. However he had persistent pleural effusions. He also has severe lung disease by history. I have followed him over time and I did a repeat chest x-ray after I saw him September 16. He still had bilateral pleural effusions but the one on the left was definitely improving.  Since the last visit the patient has definitely gained at least 5 pounds. I believe this is not fluid weight. He says that his appetite is now better since his colon surgery. He comments that his friends say that he looks better overall. He does not have edema. He is return to his prior full activities before his hemicolectomy.  No Known Allergies  Current Outpatient Prescriptions  Medication Sig Dispense Refill  . amLODipine (NORVASC) 5 MG tablet Take 1 tablet (5 mg total) by mouth daily.  30 tablet  3  . aspirin 81 MG tablet Take 81 mg by mouth every evening.      Marland Kitchen atenolol (TENORMIN) 100 MG tablet Take 50 mg by mouth every morning.       . diphenhydramine-acetaminophen (TYLENOL PM) 25-500 MG TABS Take 1 tablet by mouth at bedtime as needed.      . furosemide (LASIX) 40 MG tablet Take 1 tablet (40 mg total) by mouth 2 (two) times daily.  60 tablet  3  . losartan (COZAAR) 100 MG tablet TAKE ONE TABLET BY MOUTH EVERY DAY (STOP TAKING LISINOPRIL)  30 tablet  1  . Omega-3 Fatty Acids (FISH OIL) 1000 MG CAPS Take 1 capsule by mouth daily.      . potassium chloride (KLOR-CON 10) 10 MEQ tablet Take 10 mEq by mouth 2 (two) times daily.      . simvastatin (ZOCOR) 40 MG tablet Take 40 mg by mouth every evening.      . Tamsulosin HCl (FLOMAX) 0.4 MG CAPS Take 0.4 mg by mouth every morning.         History   Social  History  . Marital Status: Married    Spouse Name: N/A    Number of Children: 2  . Years of Education: N/A   Occupational History  . retired    Social History Main Topics  . Smoking status: Former Smoker    Quit date: 10/23/1981  . Smokeless tobacco: Not on file  . Alcohol Use: No  . Drug Use: Not on file  . Sexually Active: Not on file   Other Topics Concern  . Not on file   Social History Narrative  . No narrative on file    No family history on file.  Past Medical History  Diagnosis Date  . CAD (coronary artery disease)     Nonobstructive CAD, catheterization, [place],  October, 2005  . Ejection fraction     EF 45-50%, echo, May, 2013  . Colon polyp     Adenomatous colon polyp  . H/O left hemicolectomy   . Calculus of kidney   . Mitral regurgitation     Mild, echo, May, 2013  . Osteoarthrosis, unspecified whether generalized or localized, unspecified site   . Pericardial effusion     Small, echo, May, 2013  . Retention of urine, unspecified   .  Fatty liver     Nonalcoholic fatty liver  . Aortic valve sclerosis     Sclerosis, but no stenosis, echo, make, 2013  . Hypertension   . DM (diabetes mellitus)   . Allergic rhinitis, cause unspecified   . Edema     History of edema  . Dyslipidemia   . Chronic maxillary sinusitis   . Prostatitis, unspecified   . COPD (chronic obstructive pulmonary disease)     Pulmonary function tests, 2009, severe obstructive airway disease and severe restrictive disease and severe diffusion defect  . Shortness of breath   . Dysuria   . CHF (congestive heart failure)     Mixed systolic and diastolic CHF May, 2013, chest x-ray with bilateral pleural effusions may they 6, 2013  . Right bundle branch block     Right bundle-branch block noted as early as December, 2012.  Marland Kitchen Hoarseness     Possible dry cough  and possible course this with for his change July, 2013  . Pleural effusion     Persistent pleural effusions, left greater  than right, July, 2013    Past Surgical History  Procedure Date  . Cardaic catheterization   . Colonoscopy   . S/p hemicolectmy     Patient Active Problem List  Diagnosis  . CAD (coronary artery disease)  . Ejection fraction  . Mitral regurgitation  . Pericardial effusion  . Fatty liver  . Aortic valve sclerosis  . Hypertension  . DM (diabetes mellitus)  . Dyslipidemia  . COPD (chronic obstructive pulmonary disease)  . Shortness of breath  . CHF (congestive heart failure)  . Right bundle branch block  . Hoarseness  . Pleural effusion    ROS   Patient denies fever, chills, headache, sweats, rash, change in vision, change in hearing, chest pain, cough, nausea vomiting, urinary symptoms. All other systems are reviewed and are negative.  PHYSICAL EXAM  Patient is here with his wife. He is stable. He is oriented to person time and place. Affect is normal. Lungs reveal decreased breath sounds. However I think I do hear breath sounds to both bases. There is no respiratory distress. Cardiac exam reveals S1 and S2. There no clicks or significant murmurs. The abdomen is soft. There is no significant peripheral edema.  Filed Vitals:   02/24/12 1358  BP: 129/68  Pulse: 52  Height: 6' (1.829 m)  Weight: 199 lb 1.9 oz (90.32 kg)  SpO2: 98%     ASSESSMENT & PLAN

## 2012-02-24 NOTE — Patient Instructions (Addendum)
Your physician recommends that you schedule a follow-up appointment in: 3 months. Your physician recommends that you continue on your current medications as directed. Please refer to the Current Medication list given to you today. 

## 2012-02-24 NOTE — Assessment & Plan Note (Signed)
The patient had a small paracardial effusion in May, 2013. Over time we will repeat the echo.

## 2012-02-24 NOTE — Assessment & Plan Note (Signed)
The patient's pleural effusion is decreasing on the left. However he still has some pleural fluid. I have once again chosen to continue to follow him. I feel that further diuresis or 8 Are not indicated.

## 2012-03-03 ENCOUNTER — Other Ambulatory Visit: Payer: Self-pay | Admitting: Cardiology

## 2012-04-01 ENCOUNTER — Other Ambulatory Visit: Payer: Self-pay | Admitting: Cardiology

## 2012-04-01 MED ORDER — FUROSEMIDE 40 MG PO TABS: 40.0000 mg | ORAL_TABLET | Freq: Two times a day (BID) | ORAL | Status: DC

## 2012-04-13 ENCOUNTER — Other Ambulatory Visit: Payer: Self-pay | Admitting: *Deleted

## 2012-04-13 MED ORDER — LOSARTAN POTASSIUM 100 MG PO TABS: 100.0000 mg | ORAL_TABLET | Freq: Every day | ORAL | Status: DC

## 2012-04-14 ENCOUNTER — Other Ambulatory Visit: Payer: Self-pay | Admitting: Cardiology

## 2012-04-14 MED ORDER — LOSARTAN POTASSIUM 100 MG PO TABS: 100.0000 mg | ORAL_TABLET | Freq: Every day | ORAL | Status: DC

## 2012-05-31 ENCOUNTER — Other Ambulatory Visit: Payer: Self-pay | Admitting: *Deleted

## 2012-05-31 ENCOUNTER — Encounter: Payer: Self-pay | Admitting: Cardiology

## 2012-05-31 ENCOUNTER — Ambulatory Visit (INDEPENDENT_AMBULATORY_CARE_PROVIDER_SITE_OTHER): Payer: Medicare Other | Admitting: Cardiology

## 2012-05-31 VITALS — BP 144/72 | HR 54 | Ht 72.0 in | Wt 208.0 lb

## 2012-05-31 DIAGNOSIS — I2510 Atherosclerotic heart disease of native coronary artery without angina pectoris: Secondary | ICD-10-CM

## 2012-05-31 DIAGNOSIS — R0989 Other specified symptoms and signs involving the circulatory and respiratory systems: Secondary | ICD-10-CM

## 2012-05-31 DIAGNOSIS — I10 Essential (primary) hypertension: Secondary | ICD-10-CM

## 2012-05-31 DIAGNOSIS — I319 Disease of pericardium, unspecified: Secondary | ICD-10-CM

## 2012-05-31 DIAGNOSIS — I359 Nonrheumatic aortic valve disorder, unspecified: Secondary | ICD-10-CM

## 2012-05-31 DIAGNOSIS — J90 Pleural effusion, not elsewhere classified: Secondary | ICD-10-CM

## 2012-05-31 DIAGNOSIS — I509 Heart failure, unspecified: Secondary | ICD-10-CM

## 2012-05-31 DIAGNOSIS — R9430 Abnormal result of cardiovascular function study, unspecified: Secondary | ICD-10-CM

## 2012-05-31 DIAGNOSIS — I358 Other nonrheumatic aortic valve disorders: Secondary | ICD-10-CM

## 2012-05-31 DIAGNOSIS — I313 Pericardial effusion (noninflammatory): Secondary | ICD-10-CM

## 2012-05-31 MED ORDER — AMLODIPINE BESYLATE 5 MG PO TABS: 5.0000 mg | ORAL_TABLET | Freq: Every day | ORAL | Status: DC

## 2012-05-31 NOTE — Assessment & Plan Note (Signed)
The patient has had some pleural effusions in the past. Clinically he is doing well. I've chosen not to do a followup chest x-ray.

## 2012-05-31 NOTE — Assessment & Plan Note (Signed)
Coronary disease is stable. He has not had any type of testing for ischemia since 2005. I will consider this when I see him back in followup.

## 2012-05-31 NOTE — Assessment & Plan Note (Signed)
Aortic valve will be reassessed with his echo.

## 2012-05-31 NOTE — Assessment & Plan Note (Signed)
Ejection fraction in the past had been in the 60% range. In May, 2013, EF was 45-50%. The patient now is stable. We need to proceed with a followup echo to reassess his LV function.

## 2012-05-31 NOTE — Assessment & Plan Note (Signed)
Blood pressures control. No change in therapy. 

## 2012-05-31 NOTE — Assessment & Plan Note (Signed)
The patient had a small pericardial effusion the past but this followup echo we will see if it is gone.

## 2012-05-31 NOTE — Patient Instructions (Addendum)
Your physician recommends that you schedule a follow-up appointment in: 6 months. You will receive a reminder letter in the mail in about 4 months reminding you to call and schedule your appointment. If you don't receive this letter, please contact our office. Your physician recommends that you continue on your current medications as directed. Please refer to the Current Medication list given to you today. Your physician has requested that you have an echocardiogram. Echocardiography is a painless test that uses sound waves to create images of your heart. It provides your doctor with information about the size and shape of your heart and how well your heart's chambers and valves are working. This procedure takes approximately one hour. There are no restrictions for this procedure.  

## 2012-05-31 NOTE — Progress Notes (Signed)
HPI   The patient is seen today for cardiology followup. He continues to do well. I saw him last October, 2013. Early in 2013 he had a hemicolectomy. When I saw him in July, 2013 he was volume overloaded. His echo had shown an ejection fraction of 45-50% in May, 2013. This was slightly reduced from the past. There had been a small pericardial effusion. He also had some persistent pleural effusions that were watched by chest x-ray over time. His volume status has been under control. He had a colonoscopy recently to followup his hemicolectomy. There were 3 polyps removed. Everything looked good in general.  No Known Allergies  Current Outpatient Prescriptions  Medication Sig Dispense Refill  . amLODipine (NORVASC) 5 MG tablet TAKE ONE TABLET BY MOUTH EVERY DAY  30 tablet  2  . aspirin 81 MG tablet Take 81 mg by mouth every evening.      Marland Kitchen atenolol (TENORMIN) 100 MG tablet Take 50 mg by mouth every morning.       . diphenhydramine-acetaminophen (TYLENOL PM) 25-500 MG TABS Take 1 tablet by mouth at bedtime as needed.      . furosemide (LASIX) 40 MG tablet Take 1 tablet (40 mg total) by mouth 2 (two) times daily.  60 tablet  3  . losartan (COZAAR) 100 MG tablet Take 1 tablet (100 mg total) by mouth daily.  30 tablet  3  . Omega-3 Fatty Acids (FISH OIL) 1000 MG CAPS Take 1 capsule by mouth daily.      . potassium chloride (KLOR-CON 10) 10 MEQ tablet Take 10 mEq by mouth 2 (two) times daily.      . simvastatin (ZOCOR) 40 MG tablet Take 40 mg by mouth every evening.      . Tamsulosin HCl (FLOMAX) 0.4 MG CAPS Take 0.4 mg by mouth every morning.         History   Social History  . Marital Status: Married    Spouse Name: N/A    Number of Children: 2  . Years of Education: N/A   Occupational History  . retired    Social History Main Topics  . Smoking status: Former Smoker    Quit date: 10/23/1981  . Smokeless tobacco: Not on file  . Alcohol Use: No  . Drug Use: Not on file  . Sexually  Active: Not on file   Other Topics Concern  . Not on file   Social History Narrative  . No narrative on file    No family history on file.  Past Medical History  Diagnosis Date  . CAD (coronary artery disease)     Nonobstructive CAD, catheterization, [place],  October, 2005  . Ejection fraction     EF 45-50%, echo, May, 2013  . Colon polyp     Adenomatous colon polyp  . H/O left hemicolectomy   . Calculus of kidney   . Mitral regurgitation     Mild, echo, May, 2013  . Osteoarthrosis, unspecified whether generalized or localized, unspecified site   . Pericardial effusion     Small, echo, May, 2013  . Retention of urine, unspecified   . Fatty liver     Nonalcoholic fatty liver  . Aortic valve sclerosis     Sclerosis, but no stenosis, echo, make, 2013  . Hypertension   . DM (diabetes mellitus)   . Allergic rhinitis, cause unspecified   . Edema     History of edema  . Dyslipidemia   . Chronic  maxillary sinusitis   . Prostatitis, unspecified   . COPD (chronic obstructive pulmonary disease)     Pulmonary function tests, 2009, severe obstructive airway disease and severe restrictive disease and severe diffusion defect  . Shortness of breath   . Dysuria   . CHF (congestive heart failure)     Mixed systolic and diastolic CHF May, 2013, chest x-ray with bilateral pleural effusions may they 6, 2013  . Right bundle branch block     Right bundle-branch block noted as early as December, 2012.  Marland Kitchen Hoarseness     Possible dry cough  and possible course this with for his change July, 2013  . Pleural effusion     Persistent pleural effusions, left greater than right, July, 2013    Past Surgical History  Procedure Date  . Cardaic catheterization   . Colonoscopy   . S/p hemicolectmy     Patient Active Problem List  Diagnosis  . CAD (coronary artery disease)  . Ejection fraction  . Mitral regurgitation  . Pericardial effusion  . Fatty liver  . Aortic valve sclerosis    . Hypertension  . DM (diabetes mellitus)  . Dyslipidemia  . COPD (chronic obstructive pulmonary disease)  . Shortness of breath  . CHF (congestive heart failure)  . Right bundle branch block  . Hoarseness  . Pleural effusion    ROS   Patient denies fever, chills, headache, sweats, rash, change in vision, change in hearing, chest pain, cough, nausea vomiting, urinary symptoms. All other systems are reviewed and are negative.  PHYSICAL EXAM  Patient looks good today. He is here with his wife. There is no jugular venous distention. Lungs reveal a few scattered rhonchi. I do hear breath sounds at the base posteriorly on both sides. There is no respiratory distress. Cardiac exam reveals S1 and S2. There no clicks or significant murmurs. Abdomen is soft. There is no significant peripheral edema.  Filed Vitals:   05/31/12 1031  BP: 144/72  Pulse: 54  Height: 6' (1.829 m)  Weight: 208 lb (94.348 kg)     ASSESSMENT & PLAN

## 2012-05-31 NOTE — Assessment & Plan Note (Signed)
Patient had mixed systolic and diastolic CHF in May, 2013. He is clinically improved.

## 2012-06-16 ENCOUNTER — Other Ambulatory Visit: Payer: Self-pay

## 2012-06-16 ENCOUNTER — Other Ambulatory Visit (INDEPENDENT_AMBULATORY_CARE_PROVIDER_SITE_OTHER): Payer: Medicare Other

## 2012-06-16 DIAGNOSIS — I319 Disease of pericardium, unspecified: Secondary | ICD-10-CM

## 2012-06-16 DIAGNOSIS — I358 Other nonrheumatic aortic valve disorders: Secondary | ICD-10-CM

## 2012-06-16 DIAGNOSIS — I2510 Atherosclerotic heart disease of native coronary artery without angina pectoris: Secondary | ICD-10-CM

## 2012-06-16 DIAGNOSIS — I509 Heart failure, unspecified: Secondary | ICD-10-CM

## 2012-06-16 DIAGNOSIS — R0989 Other specified symptoms and signs involving the circulatory and respiratory systems: Secondary | ICD-10-CM

## 2012-06-16 DIAGNOSIS — R9430 Abnormal result of cardiovascular function study, unspecified: Secondary | ICD-10-CM

## 2012-06-16 DIAGNOSIS — I313 Pericardial effusion (noninflammatory): Secondary | ICD-10-CM

## 2012-06-18 ENCOUNTER — Encounter: Payer: Self-pay | Admitting: Cardiology

## 2012-06-18 ENCOUNTER — Telehealth: Payer: Self-pay | Admitting: *Deleted

## 2012-06-18 NOTE — Telephone Encounter (Signed)
Message copied by Eustace Moore on Fri Jun 18, 2012 10:48 AM ------      Message from: Myrtis Ser, [place] D      Created: Fri Jun 18, 2012  9:58 AM       Echo looks good ------

## 2012-06-18 NOTE — Telephone Encounter (Signed)
Patient informed. 

## 2012-11-01 ENCOUNTER — Other Ambulatory Visit: Payer: Self-pay | Admitting: Cardiology

## 2012-11-01 MED ORDER — FUROSEMIDE 40 MG PO TABS: 40.0000 mg | ORAL_TABLET | Freq: Two times a day (BID) | ORAL | Status: DC

## 2012-12-15 ENCOUNTER — Encounter: Payer: Self-pay | Admitting: Cardiology

## 2012-12-15 ENCOUNTER — Ambulatory Visit (INDEPENDENT_AMBULATORY_CARE_PROVIDER_SITE_OTHER): Payer: Medicare Other | Admitting: Cardiology

## 2012-12-15 VITALS — BP 126/60 | HR 53 | Ht 72.0 in | Wt 211.0 lb

## 2012-12-15 DIAGNOSIS — I509 Heart failure, unspecified: Secondary | ICD-10-CM

## 2012-12-15 DIAGNOSIS — I2510 Atherosclerotic heart disease of native coronary artery without angina pectoris: Secondary | ICD-10-CM

## 2012-12-15 DIAGNOSIS — R0602 Shortness of breath: Secondary | ICD-10-CM

## 2012-12-15 NOTE — Assessment & Plan Note (Signed)
The patient is completely improved from the episode of CHF he had in May, 2013. His LV function has normalized. No further workup at this time.

## 2012-12-15 NOTE — Progress Notes (Signed)
HPI  Patient is seen today to followup his cardiac function. Saw the patient in February, 2014 decision was made to proceed with a followup echo. His EF was 55%. This was better than May, 2013. He is much better. He's not having any shortness of breath or chest pain. We know from 2005 that he had mild coronary disease.  No Known Allergies  Current Outpatient Prescriptions  Medication Sig Dispense Refill  . amLODipine (NORVASC) 5 MG tablet Take 1 tablet (5 mg total) by mouth daily.  30 tablet  6  . aspirin 81 MG tablet Take 81 mg by mouth every evening.      Marland Kitchen atenolol (TENORMIN) 100 MG tablet Take 50 mg by mouth every morning.       . diphenhydramine-acetaminophen (TYLENOL PM) 25-500 MG TABS Take 1 tablet by mouth at bedtime as needed.      . furosemide (LASIX) 40 MG tablet Take 1 tablet (40 mg total) by mouth 2 (two) times daily.  60 tablet  3  . losartan (COZAAR) 100 MG tablet Take 1 tablet (100 mg total) by mouth daily.  30 tablet  3  . Omega-3 Fatty Acids (FISH OIL) 1000 MG CAPS Take 1 capsule by mouth daily.      . potassium chloride (KLOR-CON 10) 10 MEQ tablet Take 10 mEq by mouth 2 (two) times daily.      . simvastatin (ZOCOR) 40 MG tablet Take 40 mg by mouth every evening.      . Tamsulosin HCl (FLOMAX) 0.4 MG CAPS Take 0.4 mg by mouth every morning.        No current facility-administered medications for this visit.    History   Social History  . Marital Status: Married    Spouse Name: N/A    Number of Children: 2  . Years of Education: N/A   Occupational History  . retired    Social History Main Topics  . Smoking status: Former Smoker    Quit date: 10/23/1981  . Smokeless tobacco: Not on file  . Alcohol Use: No  . Drug Use: Not on file  . Sexual Activity: Not on file   Other Topics Concern  . Not on file   Social History Narrative  . No narrative on file    No family history on file.  Past Medical History  Diagnosis Date  . CAD (coronary artery  disease)     Nonobstructive CAD, catheterization, [place],  October, 2005  . Ejection fraction     EF 45-50%, echo, May, 2013  . Colon polyp     Adenomatous colon polyp  . H/O left hemicolectomy   . Calculus of kidney   . Mitral regurgitation     Mild, echo, May, 2013  . Osteoarthrosis, unspecified whether generalized or localized, unspecified site   . Pericardial effusion     Small, echo, May, 2013  . Retention of urine, unspecified   . Fatty liver     Nonalcoholic fatty liver  . Aortic valve sclerosis     Sclerosis, but no stenosis, echo, make, 2013  . Hypertension   . DM (diabetes mellitus)   . Allergic rhinitis, cause unspecified   . Edema     History of edema  . Dyslipidemia   . Chronic maxillary sinusitis   . Prostatitis, unspecified   . COPD (chronic obstructive pulmonary disease)     Pulmonary function tests, 2009, severe obstructive airway disease and severe restrictive disease and severe diffusion defect  .  Shortness of breath   . Dysuria   . CHF (congestive heart failure)     Mixed systolic and diastolic CHF May, 2013, chest x-ray with bilateral pleural effusions may they 6, 2013  . Right bundle branch block     Right bundle-branch block noted as early as December, 2012.  Marland Kitchen Hoarseness     Possible dry cough  and possible course this with for his change July, 2013  . Pleural effusion     Persistent pleural effusions, left greater than right, July, 2013    Past Surgical History  Procedure Laterality Date  . Cardaic catheterization    . Colonoscopy    . S/p hemicolectmy      Patient Active Problem List   Diagnosis Date Noted  . Pleural effusion   . Hoarseness   . CAD (coronary artery disease)   . Ejection fraction   . Mitral regurgitation   . Pericardial effusion   . Fatty liver   . Aortic valve sclerosis   . Hypertension   . DM (diabetes mellitus)   . Dyslipidemia   . COPD (chronic obstructive pulmonary disease)   . Shortness of breath   . CHF  (congestive heart failure)   . Right bundle branch block     ROS   Patient denies fever, chills, headache, sweats, rash, change in vision, change in hearing, chest pain, cough, nausea vomiting, urinary symptoms. All other systems are reviewed and are negative.  PHYSICAL EXAM  Patient is oriented to person time and place. Affect is normal. There is no jugulovenous distention. Lungs are clear. Respiratory effort is nonlabored. Cardiac exam reveals S1-S2. There no clicks or significant murmurs. Abdomen is soft. There is no peripheral edema.  Filed Vitals:   12/15/12 1038  BP: 126/60  Pulse: 53  Height: 6' (1.829 m)  Weight: 211 lb (95.709 kg)   EKG is done today and reviewed by me. There is sinus rhythm. There is old right bundle branch block. There is no significant change.  ASSESSMENT & PLAN

## 2012-12-15 NOTE — Patient Instructions (Addendum)

## 2012-12-15 NOTE — Assessment & Plan Note (Signed)
He is not having any significant shortness of breath. No change in therapy. 

## 2012-12-15 NOTE — Assessment & Plan Note (Addendum)
Coronary disease is stable. He is not having symptoms. Catheterization in 2005 revealed minor scattered lesions. I've consider doing an exercise test. It seems that he may have had one in his primary care office. We will see if we can obtain this information. It is important to note that he is not able to walk on a treadmill if any type of testing is to be done.   [name] called his primary care office. There was no stress test found. Plan to talk ab out it at next visit.

## 2013-01-03 ENCOUNTER — Other Ambulatory Visit: Payer: Self-pay | Admitting: *Deleted

## 2013-01-03 MED ORDER — AMLODIPINE BESYLATE 5 MG PO TABS: 5.0000 mg | ORAL_TABLET | Freq: Every day | ORAL | Status: DC

## 2015-07-17 ENCOUNTER — Encounter: Payer: Self-pay | Admitting: Cardiology

## 2015-08-22 ENCOUNTER — Ambulatory Visit (INDEPENDENT_AMBULATORY_CARE_PROVIDER_SITE_OTHER): Payer: Medicare Other | Admitting: Cardiology

## 2015-08-22 ENCOUNTER — Encounter: Payer: Self-pay | Admitting: Cardiology

## 2015-08-22 VITALS — BP 126/63 | HR 52 | Ht 72.0 in | Wt 205.6 lb

## 2015-08-22 DIAGNOSIS — I4510 Unspecified right bundle-branch block: Secondary | ICD-10-CM | POA: Diagnosis not present

## 2015-08-22 DIAGNOSIS — I2510 Atherosclerotic heart disease of native coronary artery without angina pectoris: Secondary | ICD-10-CM | POA: Diagnosis not present

## 2015-08-22 DIAGNOSIS — E785 Hyperlipidemia, unspecified: Secondary | ICD-10-CM | POA: Diagnosis not present

## 2015-08-22 DIAGNOSIS — I429 Cardiomyopathy, unspecified: Secondary | ICD-10-CM | POA: Diagnosis not present

## 2015-08-22 DIAGNOSIS — I10 Essential (primary) hypertension: Secondary | ICD-10-CM

## 2015-08-22 DIAGNOSIS — I428 Other cardiomyopathies: Secondary | ICD-10-CM

## 2015-08-22 NOTE — Progress Notes (Signed)
Cardiology Office Note  Date: 08/22/2015   ID: Ryan [name], DOB [birth year], MRN [id number]  PCP: Ryan Peri, MD  Primary Cardiologist: Ryan Dell, MD   Chief Complaint  Patient presents with  . History of cardiomyopathy    History of Present Illness: Ryan [name] is an 82 y.o. male former patient of Dr. Myrtis Ser, not seen since 2014. He is now establishing follow-up with me, this is our first meeting in the office. I reviewed records and updated his chart. He is here today with his wife for a follow-up visit.  He has a history of nonischemic cardiomyopathy based on chart review, LVEF was as low as 45% back in 2013. Most recent echocardiogram in 2014 showed normalization of LVEF. Previous cardiac catheterization in 2005 showed mild nonobstructive coronary disease.  He reports NYHA class II dyspnea. No exertional chest pain, palpitations, or syncope. He is limited by arthritic knee pain. Still enjoys doing outdoor work as tolerated. Follow-up ECG today showed sinus bradycardia with right bundle branch block and prolonged PR interval.  Past Medical History  Diagnosis Date  . CAD (coronary artery disease)     Mild nonobstructive disease 2005  . History of cardiomyopathy     Normalization of LVEF on medical therapy  . Colon polyp   . Calculus of kidney   . Osteoarthritis   . Fatty liver   . Essential hypertension   . Type 2 diabetes mellitus (HCC)   . Allergic rhinitis   . Dyslipidemia   . History of prostatitis   . COPD (chronic obstructive pulmonary disease) (HCC)   . Right bundle branch block     Past Surgical History  Procedure Laterality Date  . Colonoscopy    . Hemicolectomy      Current Outpatient Prescriptions  Medication Sig Dispense Refill  . amLODipine (NORVASC) 10 MG tablet Take 10 mg by mouth daily.    Marland Kitchen aspirin 81 MG tablet Take 81 mg by mouth every evening.    Marland Kitchen atenolol (TENORMIN) 100 MG tablet Take 50 mg by mouth every morning.     . furosemide  (LASIX) 40 MG tablet Take 1 tablet (40 mg total) by mouth 2 (two) times daily. (Patient taking differently: Take 40 mg by mouth daily. ) 60 tablet 3  . losartan (COZAAR) 100 MG tablet Take 1 tablet (100 mg total) by mouth daily. 30 tablet 3  . metFORMIN (GLUCOPHAGE) 500 MG tablet Take 1 tab by mouth every morning & 2 tabs every evening    . Omega-3 Fatty Acids (FISH OIL) 1000 MG CAPS Take 1 capsule by mouth 2 (two) times daily.     . potassium chloride (KLOR-CON 10) 10 MEQ tablet Take 10 mEq by mouth 2 (two) times daily.    . simvastatin (ZOCOR) 40 MG tablet Take 40 mg by mouth every evening.    . Tamsulosin HCl (FLOMAX) 0.4 MG CAPS Take 0.4 mg by mouth every morning.     . vitamin B-12 (CYANOCOBALAMIN) 250 MCG tablet Take 250 mcg by mouth daily.     No current facility-administered medications for this visit.   Allergies:  Review of patient's allergies indicates no known allergies.   Social History: The patient  reports that he quit smoking about 33 years ago. His smoking use included Cigarettes. He does not have any smokeless tobacco history on file. He reports that he does not drink alcohol.   Family History: The patient's family history is not on file.   ROS:  Please see the history of present illness. Otherwise, complete review of systems is positive for mildly decreased hearing.  All other systems are reviewed and negative.   Physical Exam: VS:  BP 126/63 mmHg  Pulse 52  Ht 6' (1.829 m)  Wt 205 lb 9.6 oz (93.26 kg)  BMI 27.88 kg/m2  SpO2 97%, BMI Body mass index is 27.88 kg/(m^2).  Wt Readings from Last 3 Encounters:  08/22/15 205 lb 9.6 oz (93.26 kg)  12/15/12 211 lb (95.709 kg)  05/31/12 208 lb (94.348 kg)    General: Elderly male, appears comfortable at rest. HEENT: Conjunctiva and lids normal, oropharynx clear. Neck: Supple, no elevated JVP or carotid bruits, no thyromegaly. Lungs: Clear to auscultation, nonlabored breathing at rest. Cardiac: Regular rate and rhythm, no  S3, soft systolic murmur, no pericardial rub. Abdomen: Soft, nontender, bowel sounds present. Extremities: No pitting edema, distal pulses 2+. Skin: Warm and dry. Musculoskeletal: No kyphosis. Neuropsychiatric: Alert and oriented x3, affect grossly appropriate.  ECG: I personally reviewed the prior tracing from 12/15/2012 which showed sinus bradycardia with prolonged PR interval and right bundle branch block.  Other Studies Reviewed Today:  Echocardiogram 06/16/2012: Study Conclusions  - Left ventricle: The cavity size was at the upper limits of normal. Wall thickness was increased in a pattern of mild LVH. Systolic function was normal. The estimated ejection fraction was 55%. Probable mild hypokinesis of the basalinferolateral myocardium. Doppler parameters are consistent with abnormal left ventricular relaxation (grade 1 diastolic dysfunction). Unable to compare with prior images. - Aortic valve: Mildly calcified annulus. Trileaflet; mildly calcified leaflets. Sclerosis without stenosis. Trivial regurgitation. Mean gradient: 37mm Hg (S). - Mitral valve: Trivial regurgitation. - Left atrium: The atrium was moderately dilated. - Tricuspid valve: Trivial regurgitation. - Pulmonary arteries: PA peak pressure: 23mm Hg (S). - Pericardium, extracardiac: A trivial pericardial effusion was identified along the right atrial free wall. There was a left pleural effusion.  Cardiac catheterization 02/01/2004: CORONARIES: The left main had 25% stenosis.  The LAD had a proximal 30% stenosis and mid long 25% stenosis. There were diffuse luminal irregularities. The first diagonal was moderate-sized and normal. The second diagonal and third diagonal were small.  The circumflex in the AV groove had a 60% stenosis before a large obtuse marginal. I did inject intracoronary nitroglycerin and this did not appear to be more severe than that after the injection of  this. There was a ramus intermedius which was small-to-moderate-sized with ostial 30% to 40% stenosis. An OM-1 was large with luminal irregularities. The OM-2 was normal after the 60% lesion.  The right coronary artery is a large dominant vessel. There were diffuse 25% lesions in the proximal and mid-body.  The PDA was moderate-sized with diffuse luminal irregularities. Posterolateral 1 and posterolateral 2 were small.  LEFT VENTRICULOGRAM: The left ventriculogram was obtained in the RAO projection. The ER was 60% with well-preserved wall motion.  CONCLUSION: Non-obstructive coronary disease. Well-preserved ejection fraction.  Assessment and Plan:  1. History of nonischemic cardiomyopathy, LVEF had improved to normal range by last echocardiogram in 2014. ECG reviewed and overall stable. Obtaining a follow-up echocardiogram to reassess LVEF and ensure that we do not need to make any medication adjustments.  2. Mild, nonobstructive CAD based on previous cardiac catheterization. He does not report any active angina symptoms. Current medical regimen includes aspirin, beta blocker, and statin. Continue observation.  3. Hyperlipidemia, on Zocor. He follows with Dr. Sherryll Burger.  4. Essential hypertension, blood pressure is well controlled today. He  is on Tenormin, Norvasc, and Cozaar.  Current medicines were reviewed with the patient today.   Orders Placed This Encounter  Procedures  . EKG 12-Lead  . ECHOCARDIOGRAM COMPLETE    Disposition: FU with me in 1 year.   Signed, Jonelle Sidle, MD, [facility] 08/22/2015 4:23 PM    [facility] [address] [place], [place], [place] [address] Phone: [phone]; Fax: [phone]

## 2015-08-22 NOTE — Patient Instructions (Signed)
Your physician recommends that you continue on your current medications as directed. Please refer to the Current Medication list given to you today. Your physician has requested that you have an echocardiogram. Echocardiography is a painless test that uses sound waves to create images of your heart. It provides your doctor with information about the size and shape of your heart and how well your heart's chambers and valves are working. This procedure takes approximately one hour. There are no restrictions for this procedure. Your physician recommends that you schedule a follow-up appointment in: 1 year. You will receive a reminder letter in the mail in about 10 months reminding you to call and schedule your appointment. If you don't receive this letter, please contact our office. 

## 2015-08-30 ENCOUNTER — Other Ambulatory Visit: Payer: Self-pay

## 2015-08-30 ENCOUNTER — Ambulatory Visit (INDEPENDENT_AMBULATORY_CARE_PROVIDER_SITE_OTHER): Payer: Medicare Other

## 2015-08-30 DIAGNOSIS — I2510 Atherosclerotic heart disease of native coronary artery without angina pectoris: Secondary | ICD-10-CM | POA: Diagnosis not present

## 2015-08-30 DIAGNOSIS — I4510 Unspecified right bundle-branch block: Secondary | ICD-10-CM | POA: Diagnosis not present

## 2015-08-31 ENCOUNTER — Telehealth: Payer: Self-pay | Admitting: *Deleted

## 2015-08-31 NOTE — Telephone Encounter (Signed)
Patient informed. 

## 2015-08-31 NOTE — Telephone Encounter (Signed)
-----   Message from Jonelle Sidle, MD sent at 08/30/2015 12:52 PM EDT ----- Reviewed report. LVEF remains normal range at 60-65%. Would continue current medical regimen.

## 2016-03-03 NOTE — Progress Notes (Signed)
Cardiology Office Note  Date: 03/05/2016   ID: Ryan ChrisDavid L Rudie, DOB [birth year], MRN [id number]  PCP: Kirstie PeriSHAH,ASHISH, MD  Primary Cardiologist: Nona DellSamuel McDowell, MD   Chief Complaint  Patient presents with  . Coronary Artery Disease    History of Present Illness: Ryan [name] is an 82 y.o. male last seen in April. He presents for a follow-up visit stating that he has been more short of breath over the last few weeks with exertion. He noticed this after helping a neighbor trim a bush and walk back to his house. He has been more short of breath than normal with routine activities, also coughing intermittently, but no wheezes. Feels "tight" in his chest when he exerts himself.  Follow-up echocardiogram from May is outlined below, LVEF 60-65% range. He does have a history of mild nonobstructive CAD as of 2005, has not had any recent follow-up ischemic testing.  I reviewed his medications which are stable from a cardiac perspective and include aspirin, Norvasc, Lasix, Cozaar, Toprol-XL, potassium supplements, and Zocor.  Past Medical History:  Diagnosis Date  . Allergic rhinitis   . CAD (coronary artery disease)    Mild nonobstructive disease 2005  . Calculus of kidney   . Colon polyp   . COPD (chronic obstructive pulmonary disease) (HCC)   . Dyslipidemia   . Essential hypertension   . Fatty liver   . History of cardiomyopathy    Normalization of LVEF on medical therapy  . History of prostatitis   . Osteoarthritis   . Right bundle branch block   . Type 2 diabetes mellitus (HCC)     Past Surgical History:  Procedure Laterality Date  . COLONOSCOPY    . HEMICOLECTOMY      Current Outpatient Prescriptions  Medication Sig Dispense Refill  . amLODipine (NORVASC) 10 MG tablet Take 10 mg by mouth daily.    Marland Kitchen. aspirin 81 MG tablet Take 81 mg by mouth every evening.    . diphenhydramine-acetaminophen (TYLENOL PM) 25-500 MG TABS tablet Take 1 tablet by mouth at bedtime as needed.    .  furosemide (LASIX) 40 MG tablet Take 1 tablet (40 mg total) by mouth 2 (two) times daily. (Patient taking differently: Take 40 mg by mouth 2 (two) times daily. ) 60 tablet 3  . losartan (COZAAR) 100 MG tablet Take 1 tablet (100 mg total) by mouth daily. 30 tablet 3  . metFORMIN (GLUCOPHAGE) 500 MG tablet Take 500 mg by mouth 3 (three) times daily.     . metoprolol succinate (TOPROL-XL) 50 MG 24 hr tablet Take 1 tablet by mouth daily.    . Omega-3 Fatty Acids (FISH OIL) 1000 MG CAPS Take 1 capsule by mouth 2 (two) times daily.     . potassium chloride (KLOR-CON 10) 10 MEQ tablet Take 10 mEq by mouth 2 (two) times daily.    . simvastatin (ZOCOR) 40 MG tablet Take 40 mg by mouth every evening.    . Tamsulosin HCl (FLOMAX) 0.4 MG CAPS Take 0.4 mg by mouth every morning.     . vitamin B-12 (CYANOCOBALAMIN) 500 MCG tablet Take 500 mcg by mouth daily.     No current facility-administered medications for this visit.    Allergies:  Patient has no known allergies.   Social History: The patient  reports that he quit smoking about 31 years ago. His smoking use included Cigarettes. He started smoking about 63 years ago. He has a 80.00 pack-year smoking history. He has never used  smokeless tobacco. He reports that he does not drink alcohol.   ROS:  Please see the history of present illness. Otherwise, complete review of systems is positive for arthritis pains.  All other systems are reviewed and negative.   Physical Exam: VS:  BP 137/72   Pulse 73   Ht 6' (1.829 m)   Wt 207 lb (93.9 kg)   SpO2 100% Comment: on room air  BMI 28.07 kg/m , BMI Body mass index is 28.07 kg/m.  Wt Readings from Last 3 Encounters:  03/05/16 207 lb (93.9 kg)  08/22/15 205 lb 9.6 oz (93.3 kg)  12/15/12 211 lb (95.7 kg)    General: Elderly male, appears comfortable at rest. HEENT: Conjunctiva and lids normal, oropharynx clear. Neck: Supple, no elevated JVP or carotid bruits, no thyromegaly. Lungs: Clear to auscultation,  nonlabored breathing at rest. Cardiac: Regular rate and rhythm, no S3, soft systolic murmur, no pericardial rub. Abdomen: Soft, nontender, bowel sounds present. Extremities: No pitting edema, distal pulses 2+. Skin: Warm and dry. Musculoskeletal: No kyphosis. Neuropsychiatric: Alert and oriented x3, affect grossly appropriate.  ECG: I personally reviewed the tracing from 08/22/2015 which showed sinus bradycardia with prolonged PR interval and right bundle branch block.  Other Studies Reviewed Today:  Echocardiogram 08/30/2015: Study Conclusions  - Left ventricle: The cavity size was normal. Wall thickness was   normal. Systolic function was normal. The estimated ejection   fraction was in the range of 60% to 65%. Diastolic function is   abnormal, indeterminate grade. There is evidence of elevated LA   pressure. Wall motion was normal; there were no regional wall   motion abnormalities. - Aortic valve: Mildly calcified annulus. Mildly thickened   leaflets. There was trivial regurgitation. Valve area (VTI): 2.13   cm^2. Valve area (Vmax): 1.98 cm^2. Valve area (Vmean): 1.87   cm^2. - Mitral valve: There was mild regurgitation. - Pulmonary veins: There is systolic blunting of pulmonary vein   flow consistent with elevated LA pressure. - Atrial septum: Possibly aneurysmal but difficult to visualized.   No ASD or PFO. - Inferior vena cava: The vessel was dilated. The respirophasic   diameter changes were blunted (< 50%), consistent with elevated   central venous pressure. - Pericardium, extracardiac: There is a small circumferential   pericardial effusion measuring 0.8 cm adjacent to the LV in   diastole. - Technically adequate study.  Assessment and Plan:  1. Increasing dyspnea on exertion and chest tightness. Patient has a history of mild nonobstructive CAD as of 2005, no recent ischemic testing. We will obtain a Lexiscan Myoview for further evaluation  2. History of  cardiomyopathy with normalization of LVEF. Echocardiogram in May showed LVEF 60-65%.  3. COPD, could also be contributing to his current symptoms, no active wheezing today. If stress test is low risk, may need further pulmonary workup per Dr. Sherryll Burger.  4. Essential hypertension, blood pressure is adequately controlled today.  Current medicines were reviewed with the patient today.   Orders Placed This Encounter  Procedures  . NM Myocar Multi W/Spect W/Wall Motion / EF  . Myocardial Perfusion Imaging    Disposition: Call with results.  Signed, Jonelle Sidle, MD, [facility] 03/05/2016 9:20 AM    [facility] at [facility] [address] [place], [place], [place] [address] Phone: [phone]; Fax: [phone]

## 2016-03-05 ENCOUNTER — Encounter: Payer: Self-pay | Admitting: Cardiology

## 2016-03-05 ENCOUNTER — Encounter: Payer: Self-pay | Admitting: *Deleted

## 2016-03-05 ENCOUNTER — Ambulatory Visit (INDEPENDENT_AMBULATORY_CARE_PROVIDER_SITE_OTHER): Payer: Medicare Other | Admitting: Cardiology

## 2016-03-05 VITALS — BP 137/72 | HR 73 | Ht 72.0 in | Wt 207.0 lb

## 2016-03-05 DIAGNOSIS — I10 Essential (primary) hypertension: Secondary | ICD-10-CM | POA: Diagnosis not present

## 2016-03-05 DIAGNOSIS — I209 Angina pectoris, unspecified: Secondary | ICD-10-CM

## 2016-03-05 DIAGNOSIS — R0609 Other forms of dyspnea: Secondary | ICD-10-CM

## 2016-03-05 DIAGNOSIS — I25119 Atherosclerotic heart disease of native coronary artery with unspecified angina pectoris: Secondary | ICD-10-CM | POA: Diagnosis not present

## 2016-03-05 DIAGNOSIS — I428 Other cardiomyopathies: Secondary | ICD-10-CM

## 2016-03-05 NOTE — Patient Instructions (Signed)
Your physician wants you to follow-up in: 6 MONTHS WITH DR. MCDOWELL You will receive a reminder letter in the mail two months in advance. If you don't receive a letter, please call our office to schedule the follow-up appointment.  Your physician recommends that you continue on your current medications as directed. Please refer to the Current Medication list given to you today.  Your physician has requested that you have a lexiscan myoview. For further information please visit [email or web address]. Please follow instruction sheet, as given.  Thank you for choosing [facility]!!    

## 2016-03-12 ENCOUNTER — Inpatient Hospital Stay (HOSPITAL_COMMUNITY): Admission: RE | Admit: 2016-03-12 | Payer: Medicare Other | Source: Ambulatory Visit

## 2016-03-12 ENCOUNTER — Encounter (HOSPITAL_COMMUNITY): Payer: Medicare Other

## 2016-03-17 ENCOUNTER — Telehealth: Payer: Self-pay | Admitting: *Deleted

## 2016-03-17 ENCOUNTER — Encounter: Payer: Self-pay | Admitting: *Deleted

## 2016-03-17 NOTE — Telephone Encounter (Signed)
Pt rescheduled stress test - says he had congestion - had CXR at [facility] (requested) which pt says showed fluid - pcp gave pt Cipro and extra 20 mg of lasix for 5 days. Scheduled for stress test this Wednesday but is still congested and thinks he should reschedule until he feels better. Pt will reschedule stress test. Will forward to provider for further suggestions.

## 2016-03-17 NOTE — Telephone Encounter (Signed)
Noted. If he would like to reschedule stress testing to next week to allow him time to get better from a pulmonary perspective, this would be reasonable.

## 2016-03-17 NOTE — Telephone Encounter (Signed)
Stress test rescheduled 12/12

## 2016-03-19 ENCOUNTER — Encounter (HOSPITAL_COMMUNITY): Payer: Medicare Other

## 2016-04-08 ENCOUNTER — Encounter (HOSPITAL_COMMUNITY): Payer: Medicare Other

## 2016-05-29 ENCOUNTER — Inpatient Hospital Stay (HOSPITAL_COMMUNITY): Payer: Medicare Other

## 2016-05-29 ENCOUNTER — Encounter (HOSPITAL_COMMUNITY): Payer: Self-pay

## 2016-05-29 ENCOUNTER — Inpatient Hospital Stay (HOSPITAL_COMMUNITY)
Admission: EM | Admit: 2016-05-29 | Discharge: 2016-05-31 | DRG: 193 | Disposition: A | Discharge status: Home / Self Care | Payer: Medicare Other | Attending: Family Medicine | Admitting: Family Medicine

## 2016-05-29 ENCOUNTER — Emergency Department (HOSPITAL_COMMUNITY): Payer: Medicare Other

## 2016-05-29 DIAGNOSIS — F05 Delirium due to known physiological condition: Secondary | ICD-10-CM | POA: Diagnosis present

## 2016-05-29 DIAGNOSIS — I5033 Acute on chronic diastolic (congestive) heart failure: Secondary | ICD-10-CM | POA: Diagnosis present

## 2016-05-29 DIAGNOSIS — E785 Hyperlipidemia, unspecified: Secondary | ICD-10-CM | POA: Diagnosis present

## 2016-05-29 DIAGNOSIS — K760 Fatty (change of) liver, not elsewhere classified: Secondary | ICD-10-CM | POA: Diagnosis present

## 2016-05-29 DIAGNOSIS — N10 Acute tubulo-interstitial nephritis: Secondary | ICD-10-CM

## 2016-05-29 DIAGNOSIS — J440 Chronic obstructive pulmonary disease with acute lower respiratory infection: Secondary | ICD-10-CM | POA: Diagnosis present

## 2016-05-29 DIAGNOSIS — I2510 Atherosclerotic heart disease of native coronary artery without angina pectoris: Secondary | ICD-10-CM | POA: Diagnosis present

## 2016-05-29 DIAGNOSIS — I4510 Unspecified right bundle-branch block: Secondary | ICD-10-CM | POA: Diagnosis present

## 2016-05-29 DIAGNOSIS — R509 Fever, unspecified: Secondary | ICD-10-CM

## 2016-05-29 DIAGNOSIS — R062 Wheezing: Secondary | ICD-10-CM

## 2016-05-29 DIAGNOSIS — J449 Chronic obstructive pulmonary disease, unspecified: Secondary | ICD-10-CM | POA: Diagnosis not present

## 2016-05-29 DIAGNOSIS — Z79899 Other long term (current) drug therapy: Secondary | ICD-10-CM

## 2016-05-29 DIAGNOSIS — J90 Pleural effusion, not elsewhere classified: Secondary | ICD-10-CM | POA: Diagnosis present

## 2016-05-29 DIAGNOSIS — Z87891 Personal history of nicotine dependence: Secondary | ICD-10-CM | POA: Diagnosis not present

## 2016-05-29 DIAGNOSIS — R410 Disorientation, unspecified: Secondary | ICD-10-CM

## 2016-05-29 DIAGNOSIS — N390 Urinary tract infection, site not specified: Secondary | ICD-10-CM | POA: Diagnosis present

## 2016-05-29 DIAGNOSIS — J441 Chronic obstructive pulmonary disease with (acute) exacerbation: Secondary | ICD-10-CM | POA: Diagnosis present

## 2016-05-29 DIAGNOSIS — R740 Nonspecific elevation of levels of transaminase and lactic acid dehydrogenase [LDH]: Secondary | ICD-10-CM | POA: Diagnosis not present

## 2016-05-29 DIAGNOSIS — E0810 Diabetes mellitus due to underlying condition with ketoacidosis without coma: Secondary | ICD-10-CM | POA: Diagnosis not present

## 2016-05-29 DIAGNOSIS — R0602 Shortness of breath: Secondary | ICD-10-CM | POA: Diagnosis present

## 2016-05-29 DIAGNOSIS — I10 Essential (primary) hypertension: Secondary | ICD-10-CM | POA: Diagnosis present

## 2016-05-29 DIAGNOSIS — Z86010 Personal history of colonic polyps: Secondary | ICD-10-CM | POA: Diagnosis not present

## 2016-05-29 DIAGNOSIS — Z87442 Personal history of urinary calculi: Secondary | ICD-10-CM

## 2016-05-29 DIAGNOSIS — E119 Type 2 diabetes mellitus without complications: Secondary | ICD-10-CM | POA: Diagnosis present

## 2016-05-29 DIAGNOSIS — Z7984 Long term (current) use of oral hypoglycemic drugs: Secondary | ICD-10-CM | POA: Diagnosis not present

## 2016-05-29 DIAGNOSIS — Z7982 Long term (current) use of aspirin: Secondary | ICD-10-CM | POA: Diagnosis not present

## 2016-05-29 DIAGNOSIS — I25119 Atherosclerotic heart disease of native coronary artery with unspecified angina pectoris: Secondary | ICD-10-CM | POA: Diagnosis not present

## 2016-05-29 DIAGNOSIS — J1000 Influenza due to other identified influenza virus with unspecified type of pneumonia: Principal | ICD-10-CM | POA: Diagnosis present

## 2016-05-29 LAB — LACTIC ACID, PLASMA
Lactic Acid, Venous: 3 mmol/L (ref 0.5–1.9)
Lactic Acid, Venous: 2.4 mmol/L (ref 0.5–1.9)

## 2016-05-29 LAB — CBC WITH DIFFERENTIAL/PLATELET
BASOS ABS: 0 10*3/uL (ref 0.0–0.1)
BASOS PCT: 0 %
EOS PCT: 0 %
Eosinophils Absolute: 0 10*3/uL (ref 0.0–0.7)
HCT: 39.9 % (ref 39.0–52.0)
HEMOGLOBIN: 13.3 g/dL (ref 13.0–17.0)
Lymphocytes Relative: 7 %
Lymphs Abs: 0.5 10*3/uL — ABNORMAL LOW (ref 0.7–4.0)
MCH: 30 pg (ref 26.0–34.0)
MCHC: 33.3 g/dL (ref 30.0–36.0)
MCV: 90.1 fL (ref 78.0–100.0)
MONO ABS: 0.5 10*3/uL (ref 0.1–1.0)
MONOS PCT: 8 %
NEUTROS ABS: 5.8 10*3/uL (ref 1.7–7.7)
Neutrophils Relative %: 85 %
Platelets: DECREASED 10*3/uL (ref 150–400)
RBC: 4.43 MIL/uL (ref 4.22–5.81)
RDW: 15.3 % (ref 11.5–15.5)
Smear Review: DECREASED
WBC: 6.8 10*3/uL (ref 4.0–10.5)

## 2016-05-29 LAB — COMPREHENSIVE METABOLIC PANEL
ALBUMIN: 4.3 g/dL (ref 3.5–5.0)
ALK PHOS: 53 U/L (ref 38–126)
ALT: 22 U/L (ref 17–63)
AST: 37 U/L (ref 15–41)
Anion gap: 12 (ref 5–15)
BILIRUBIN TOTAL: 1.7 mg/dL — AB (ref 0.3–1.2)
BUN: 26 mg/dL — AB (ref 6–20)
CALCIUM: 8.5 mg/dL — AB (ref 8.9–10.3)
CO2: 23 mmol/L (ref 22–32)
CREATININE: 1.52 mg/dL — AB (ref 0.61–1.24)
Chloride: 102 mmol/L (ref 101–111)
GFR calc Af Amer: 48 mL/min — ABNORMAL LOW (ref >60)
GFR calc non Af Amer: 41 mL/min — ABNORMAL LOW (ref >60)
GLUCOSE: 167 mg/dL — AB (ref 65–99)
Potassium: 3.9 mmol/L (ref 3.5–5.1)
SODIUM: 137 mmol/L (ref 135–145)
TOTAL PROTEIN: 7.6 g/dL (ref 6.5–8.1)

## 2016-05-29 LAB — URINALYSIS, ROUTINE W REFLEX MICROSCOPIC
Bilirubin Urine: NEGATIVE
GLUCOSE, UA: NEGATIVE mg/dL
Ketones, ur: 5 mg/dL — AB
NITRITE: NEGATIVE
PH: 5 (ref 5.0–8.0)
Protein, ur: 30 mg/dL — AB
Specific Gravity, Urine: 1.014 (ref 1.005–1.030)

## 2016-05-29 LAB — GLUCOSE, CAPILLARY
Glucose-Capillary: 188 mg/dL — ABNORMAL HIGH (ref 65–99)
Glucose-Capillary: 285 mg/dL — ABNORMAL HIGH (ref 65–99)

## 2016-05-29 LAB — INFLUENZA PANEL BY PCR (TYPE A & B)
INFLAPCR: POSITIVE — AB
INFLBPCR: NEGATIVE

## 2016-05-29 LAB — BRAIN NATRIURETIC PEPTIDE: B Natriuretic Peptide: 166 pg/mL — ABNORMAL HIGH (ref 0.0–100.0)

## 2016-05-29 MED ORDER — OMEGA-3-ACID ETHYL ESTERS 1 G PO CAPS
1.0000 g | ORAL_CAPSULE | Freq: Two times a day (BID) | ORAL | Status: DC
  Administered 2016-05-29 – 2016-05-31 (×5): 1 g via ORAL
  Filled 2016-05-29 (×8): qty 1

## 2016-05-29 MED ORDER — GUAIFENESIN-DM 100-10 MG/5ML PO SYRP: 5.0000 mL | ORAL_SOLUTION | ORAL | Status: DC | PRN

## 2016-05-29 MED ORDER — SODIUM CHLORIDE 0.9 % IV BOLUS (SEPSIS): 1000.0000 mL | Freq: Once | INTRAVENOUS | Status: DC

## 2016-05-29 MED ORDER — IPRATROPIUM-ALBUTEROL 0.5-2.5 (3) MG/3ML IN SOLN
3.0000 mL | Freq: Four times a day (QID) | RESPIRATORY_TRACT | Status: DC
  Administered 2016-05-30 (×2): 3 mL via RESPIRATORY_TRACT
  Filled 2016-05-29 (×2): qty 3

## 2016-05-29 MED ORDER — SODIUM CHLORIDE 0.9 % IV SOLN: 1000.0000 mL | INTRAVENOUS | Status: DC

## 2016-05-29 MED ORDER — DEXTROSE 5 % IV SOLN
1.0000 g | Freq: Once | INTRAVENOUS | Status: AC
  Administered 2016-05-29: 1 g via INTRAVENOUS
  Filled 2016-05-29: qty 10

## 2016-05-29 MED ORDER — METOPROLOL SUCCINATE ER 50 MG PO TB24
50.0000 mg | ORAL_TABLET | Freq: Every day | ORAL | Status: DC
  Administered 2016-05-29 – 2016-05-31 (×3): 50 mg via ORAL
  Filled 2016-05-29 (×5): qty 1

## 2016-05-29 MED ORDER — OSELTAMIVIR PHOSPHATE 30 MG PO CAPS
30.0000 mg | ORAL_CAPSULE | Freq: Two times a day (BID) | ORAL | Status: DC
  Administered 2016-05-29 – 2016-05-31 (×5): 30 mg via ORAL
  Filled 2016-05-29 (×5): qty 1

## 2016-05-29 MED ORDER — ASPIRIN 81 MG PO CHEW
81.0000 mg | CHEWABLE_TABLET | Freq: Every evening | ORAL | Status: DC
Start: 2016-05-29 — End: 2016-05-31
  Administered 2016-05-29 – 2016-05-30 (×2): 81 mg via ORAL
  Filled 2016-05-29 (×2): qty 1

## 2016-05-29 MED ORDER — DEXTROSE 5 % IV SOLN: 1.0000 g | Freq: Once | INTRAVENOUS | Status: DC

## 2016-05-29 MED ORDER — INSULIN ASPART 100 UNIT/ML ~~LOC~~ SOLN
0.0000 [IU] | Freq: Three times a day (TID) | SUBCUTANEOUS | Status: DC
  Administered 2016-05-29: 2 [IU] via SUBCUTANEOUS
  Administered 2016-05-30: 5 [IU] via SUBCUTANEOUS
  Administered 2016-05-30: 2 [IU] via SUBCUTANEOUS

## 2016-05-29 MED ORDER — SIMVASTATIN 20 MG PO TABS
40.0000 mg | ORAL_TABLET | Freq: Every evening | ORAL | Status: DC
  Filled 2016-05-29: qty 2

## 2016-05-29 MED ORDER — DEXTROSE 5 % IV SOLN
500.0000 mg | INTRAVENOUS | Status: DC
  Filled 2016-05-29: qty 500

## 2016-05-29 MED ORDER — FUROSEMIDE 40 MG PO TABS
40.0000 mg | ORAL_TABLET | Freq: Two times a day (BID) | ORAL | Status: DC
  Administered 2016-05-29 – 2016-05-31 (×4): 40 mg via ORAL
  Filled 2016-05-29 (×4): qty 1

## 2016-05-29 MED ORDER — ACETAMINOPHEN 325 MG PO TABS: 650.0000 mg | ORAL_TABLET | Freq: Once | ORAL | Status: DC

## 2016-05-29 MED ORDER — ONDANSETRON HCL 4 MG PO TABS: 4.0000 mg | ORAL_TABLET | Freq: Four times a day (QID) | ORAL | Status: DC | PRN

## 2016-05-29 MED ORDER — SODIUM CHLORIDE 0.9 % IV BOLUS (SEPSIS)
1000.0000 mL | Freq: Once | INTRAVENOUS | Status: AC
  Administered 2016-05-29: 1000 mL via INTRAVENOUS

## 2016-05-29 MED ORDER — TAMSULOSIN HCL 0.4 MG PO CAPS
0.4000 mg | ORAL_CAPSULE | Freq: Every morning | ORAL | Status: DC
  Administered 2016-05-29 – 2016-05-31 (×3): 0.4 mg via ORAL
  Filled 2016-05-29 (×3): qty 1

## 2016-05-29 MED ORDER — ATORVASTATIN CALCIUM 20 MG PO TABS
20.0000 mg | ORAL_TABLET | Freq: Every day | ORAL | Status: DC
  Administered 2016-05-29 – 2016-05-30 (×2): 20 mg via ORAL
  Filled 2016-05-29 (×2): qty 1

## 2016-05-29 MED ORDER — IPRATROPIUM-ALBUTEROL 0.5-2.5 (3) MG/3ML IN SOLN
3.0000 mL | RESPIRATORY_TRACT | Status: DC
  Administered 2016-05-29 (×2): 3 mL via RESPIRATORY_TRACT
  Filled 2016-05-29 (×3): qty 3

## 2016-05-29 MED ORDER — DEXTROSE 5 % IV SOLN
2.0000 g | INTRAVENOUS | Status: DC
  Filled 2016-05-29: qty 2

## 2016-05-29 MED ORDER — LEVOFLOXACIN IN D5W 750 MG/150ML IV SOLN
750.0000 mg | INTRAVENOUS | Status: DC
  Administered 2016-05-29 – 2016-05-30 (×2): 750 mg via INTRAVENOUS
  Filled 2016-05-29 (×2): qty 150

## 2016-05-29 MED ORDER — ACETAMINOPHEN 325 MG PO TABS: 650.0000 mg | ORAL_TABLET | Freq: Four times a day (QID) | ORAL | Status: DC | PRN

## 2016-05-29 MED ORDER — METFORMIN HCL 500 MG PO TABS: 500.0000 mg | ORAL_TABLET | Freq: Three times a day (TID) | ORAL | Status: DC

## 2016-05-29 MED ORDER — ACETAMINOPHEN 650 MG RE SUPP: 650.0000 mg | Freq: Four times a day (QID) | RECTAL | Status: DC | PRN

## 2016-05-29 MED ORDER — ONDANSETRON HCL 4 MG/2ML IJ SOLN: 4.0000 mg | Freq: Four times a day (QID) | INTRAMUSCULAR | Status: DC | PRN

## 2016-05-29 MED ORDER — AMLODIPINE BESYLATE 5 MG PO TABS
10.0000 mg | ORAL_TABLET | Freq: Every day | ORAL | Status: DC
  Administered 2016-05-29 – 2016-05-31 (×3): 10 mg via ORAL
  Filled 2016-05-29 (×3): qty 2

## 2016-05-29 MED ORDER — IPRATROPIUM-ALBUTEROL 0.5-2.5 (3) MG/3ML IN SOLN
3.0000 mL | RESPIRATORY_TRACT | Status: DC | PRN
  Administered 2016-05-29 – 2016-05-31 (×2): 3 mL via RESPIRATORY_TRACT
  Filled 2016-05-29: qty 3

## 2016-05-29 MED ORDER — AZITHROMYCIN 500 MG IV SOLR
500.0000 mg | Freq: Once | INTRAVENOUS | Status: AC
  Administered 2016-05-29: 500 mg via INTRAVENOUS
  Filled 2016-05-29: qty 500

## 2016-05-29 MED ORDER — HEPARIN SODIUM (PORCINE) 5000 UNIT/ML IJ SOLN
5000.0000 [IU] | Freq: Three times a day (TID) | INTRAMUSCULAR | Status: DC
  Administered 2016-05-29 – 2016-05-31 (×7): 5000 [IU] via SUBCUTANEOUS
  Filled 2016-05-29 (×7): qty 1

## 2016-05-29 MED ORDER — METHYLPREDNISOLONE SODIUM SUCC 40 MG IJ SOLR
40.0000 mg | Freq: Three times a day (TID) | INTRAMUSCULAR | Status: DC
  Administered 2016-05-29 – 2016-05-31 (×6): 40 mg via INTRAVENOUS
  Filled 2016-05-29 (×6): qty 1

## 2016-05-29 NOTE — ED Notes (Addendum)
CRITICAL VALUE ALERT  Critical value received:  Lactic acid 3.0  Date of notification:  05/29/2016  Time of notification:  04:11  Critical value read back: yes  Nurse who received alert: Juliette Alcide RN   MD notified (1st page): Dr Lynelle Doctor  Time of first page:  04:11  MD notified (2nd page):  Time of second page:  Responding MD:  Dr Lynelle Doctor  Time MD responded: 04:11

## 2016-05-29 NOTE — ED Triage Notes (Signed)
Pt arrives by ems for 2 day history of productive cough and fever at home.  Pt was given 1000 mg of tylenol en route per ems for fever 102.2.  Pt denies pain, but states he is a little more sob than usual.

## 2016-05-29 NOTE — H&P (Signed)
History and Physical    Ryan [name] ZOX:[id number] DOB: [birth year] DOA: 05/29/2016  PCP: Kirstie Peri, MD  Patient coming from:  Home.    Chief Complaint:   SOB.   HPI: Ryan [name] is an 83 y.o. male with hx of known CAD, COPD, DM2, HLD, HTN, hx of pleural effusion, lives at home with his wife, presents to the ER with congestion, non productive coughs, loss of appetite, and sneezing.  He denied CP, fever, chills, or myalgia.  No distant travel or ill contact.  Work up in the ER included CXR which showed moderately large right pleural effusion, and improved left pleural effusion, and no infiltrate.  UA positive for UTI with TNTC Wbc's, Cr of 1.5, and negative troponin.  His lactic acid was elevated to 4, no hypotension.  He has no leukocytosis.  He was given 2 grams of Rocephin and Zithromax, and hospitalist was asked to admit him for further work up.   ED Course:  See above.  Rewiew of Systems:  Constitutional: Negative for malaise, fever and chills. No significant weight loss or weight gain Eyes: Negative for eye pain, redness and discharge, diplopia, visual changes, or flashes of light. ENMT: Negative for ear pain, hoarseness, nasal congestion, sinus pressure and sore throat. No headaches; tinnitus, drooling, or problem swallowing. Cardiovascular: Negative for chest pain, palpitations, diaphoresis, dyspnea and peripheral edema. ; No orthopnea, PND Respiratory: Negative for hemoptysis, wheezing and stridor. No pleuritic chestpain. Gastrointestinal: Negative for diarrhea, constipation,  melena, blood in stool, hematemesis, jaundice and rectal bleeding.    Genitourinary: Negative for frequency, dysuria, incontinence,flank pain and hematuria; Musculoskeletal: Negative for back pain and neck pain. Negative for swelling and trauma.;  Skin: . Negative for pruritus, rash, abrasions, bruising and skin lesion.; ulcerations Neuro: Negative for headache, lightheadedness and neck stiffness. Negative for  weakness, altered level of consciousness , altered mental status, extremity weakness, burning feet, involuntary movement, seizure and syncope.  Psych: negative for anxiety, depression, insomnia, tearfulness, panic attacks, hallucinations, paranoia, suicidal or homicidal ideation    Past Medical History:  Diagnosis Date  . Allergic rhinitis   . CAD (coronary artery disease)    Mild nonobstructive disease 2005  . Calculus of kidney   . Colon polyp   . COPD (chronic obstructive pulmonary disease) (HCC)   . Dyslipidemia   . Essential hypertension   . Fatty liver   . History of cardiomyopathy    Normalization of LVEF on medical therapy  . History of prostatitis   . Osteoarthritis   . Right bundle branch block   . Type 2 diabetes mellitus (HCC)     Past Surgical History:  Procedure Laterality Date  . COLONOSCOPY    . HEMICOLECTOMY       reports that he quit smoking about 31 years ago. His smoking use included Cigarettes. He started smoking about 63 years ago. He has a 80.00 pack-year smoking history. He has never used smokeless tobacco. He reports that he does not drink alcohol. His drug history is not on file.  No Known Allergies  No family history on file.   Prior to Admission medications   Medication Sig Start Date End Date Taking? Authorizing Provider  amLODipine (NORVASC) 10 MG tablet Take 10 mg by mouth daily.   Yes Historical Provider, MD  aspirin 81 MG tablet Take 81 mg by mouth every evening.   Yes Historical Provider, MD  diphenhydramine-acetaminophen (TYLENOL PM) 25-500 MG TABS tablet Take 1 tablet by mouth at bedtime as  needed.   Yes Historical Provider, MD  furosemide (LASIX) 40 MG tablet Take 1 tablet (40 mg total) by mouth 2 (two) times daily. Patient taking differently: Take 40 mg by mouth 2 (two) times daily.  11/01/12  Yes Luis Abed, MD  losartan (COZAAR) 100 MG tablet Take 1 tablet (100 mg total) by mouth daily. 04/14/12  Yes Luis Abed, MD  metFORMIN  (GLUCOPHAGE) 500 MG tablet Take 500 mg by mouth 3 (three) times daily.  11/05/12  Yes Historical Provider, MD  metoprolol succinate (TOPROL-XL) 50 MG 24 hr tablet Take 1 tablet by mouth daily. 01/09/16  Yes Historical Provider, MD  Omega-3 Fatty Acids (FISH OIL) 1000 MG CAPS Take 1 capsule by mouth 2 (two) times daily.    Yes Historical Provider, MD  potassium chloride (KLOR-CON 10) 10 MEQ tablet Take 10 mEq by mouth 2 (two) times daily.   Yes Historical Provider, MD  simvastatin (ZOCOR) 40 MG tablet Take 40 mg by mouth every evening.   Yes Historical Provider, MD  Tamsulosin HCl (FLOMAX) 0.4 MG CAPS Take 0.4 mg by mouth every morning.    Yes Historical Provider, MD  vitamin B-12 (CYANOCOBALAMIN) 500 MCG tablet Take 500 mcg by mouth daily.   Yes Historical Provider, MD    Physical Exam: Vitals:   05/29/16 0400 05/29/16 0430 05/29/16 0500 05/29/16 0502  BP: 133/56 142/62 130/57   Pulse: 75  73   Resp: (!) 34 26 (!) 29   Temp:    99.1 F (37.3 C)  TempSrc:    Oral  SpO2: 95%  94%   Weight:      Height:          Constitutional: NAD, calm, comfortable Vitals:   05/29/16 0400 05/29/16 0430 05/29/16 0500 05/29/16 0502  BP: 133/56 142/62 130/57   Pulse: 75  73   Resp: (!) 34 26 (!) 29   Temp:    99.1 F (37.3 C)  TempSrc:    Oral  SpO2: 95%  94%   Weight:      Height:       Eyes: PERRL, lids and conjunctivae normal ENMT: Mucous membranes are moist. Posterior pharynx clear of any exudate or lesions.Normal dentition.  Neck: normal, supple, no masses, no thyromegaly Respiratory: clear to auscultation bilaterally, no wheezing, no crackles. Normal respiratory effort. No accessory muscle use.  Cardiovascular: Regular rate and rhythm, no murmurs / rubs / gallops. No extremity edema. 2+ pedal pulses. No carotid bruits.  Abdomen: no tenderness, no masses palpated. No hepatosplenomegaly. Bowel sounds positive.  Musculoskeletal: no clubbing / cyanosis. No joint deformity upper and lower  extremities. Good ROM, no contractures. Normal muscle tone.  Skin: no rashes, lesions, ulcers. No induration Neurologic: CN 2-12 grossly intact. Sensation intact, DTR normal. Strength 5/5 in all 4.  Psychiatric: Normal judgment and insight. Alert and oriented x 3. Normal mood.     Labs on Admission: I have personally reviewed following labs and imaging studies  CBC:  Recent Labs Lab 05/29/16 0333  WBC 6.8  NEUTROABS 5.8  HGB 13.3  HCT 39.9  MCV 90.1  PLT PLATELET CLUMPS NOTED ON SMEAR, COUNT APPEARS DECREASED   Basic Metabolic Panel:  Recent Labs Lab 05/29/16 0333  NA 137  K 3.9  CL 102  CO2 23  GLUCOSE 167*  BUN 26*  CREATININE 1.52*  CALCIUM 8.5*   GFR: Estimated Creatinine Clearance: 41.8 mL/min (by C-G formula based on SCr of 1.52 mg/dL (H)). Liver Function Tests:  Recent Labs Lab 05/29/16 0333  AST 37  ALT 22  ALKPHOS 53  BILITOT 1.7*  PROT 7.6  ALBUMIN 4.3   Urine analysis:    Component Value Date/Time   COLORURINE YELLOW 05/29/2016 0349   APPEARANCEUR CLOUDY (A) 05/29/2016 0349   LABSPEC 1.014 05/29/2016 0349   PHURINE 5.0 05/29/2016 0349   GLUCOSEU NEGATIVE 05/29/2016 0349   HGBUR MODERATE (A) 05/29/2016 0349   BILIRUBINUR NEGATIVE 05/29/2016 0349   KETONESUR 5 (A) 05/29/2016 0349   PROTEINUR 30 (A) 05/29/2016 0349   NITRITE NEGATIVE 05/29/2016 0349   LEUKOCYTESUR LARGE (A) 05/29/2016 0349    Recent Results (from the past 240 hour(s))  Blood Culture (routine x 2)     Status: None (Preliminary result)   Collection Time: 05/29/16  3:33 AM  Result Value Ref Range Status   Specimen Description BLOOD RIGHT ANTECUBITAL  Final   Special Requests   Final    BOTTLES DRAWN AEROBIC AND ANAEROBIC AEB 6CC ANA 8CC   Culture PENDING  Incomplete   Report Status PENDING  Incomplete  Blood Culture (routine x 2)     Status: None (Preliminary result)   Collection Time: 05/29/16  4:05 AM  Result Value Ref Range Status   Specimen Description BLOOD RIGHT  FOREARM  Final   Special Requests BOTTLES DRAWN AEROBIC AND ANAEROBIC 10CC EACH  Final   Culture PENDING  Incomplete   Report Status PENDING  Incomplete     Radiological Exams on Admission: Dg Chest Port 1 View  Result Date: 05/29/2016 CLINICAL DATA:  Cough, fever and shortness of breath. EXAM: PORTABLE CHEST 1 VIEW COMPARISON:  Frontal and lateral views 03/14/2016 FINDINGS: Moderate-large right pleural effusion occupying the lower 1/2 of right hemithorax, likely increased from prior exam. Associated right basilar opacity. Decreased blunting of left costophrenic angle. Unchanged heart size and mediastinal contours, no mediastinal shift. Aortic arch atherosclerosis. No pneumothorax. Stable pulmonary vasculature. Bronchial thickening again seen. IMPRESSION: 1. Moderate-to-large right pleural effusion, increased from prior exam. Likely associated atelectasis. 2. Improved left pleural effusion. 3. Bronchial thickening is unchanged. 4. Aortic arch atherosclerosis. Electronically Signed   By: Rubye Oaks M.D.   On: 05/29/2016 04:21    EKG: Independently reviewed.   Assessment/Plan Principal Problem:   COPD (chronic obstructive pulmonary disease) (HCC) Active Problems:   CAD (coronary artery disease)   Hypertension   DM (diabetes mellitus) (HCC)   Dyslipidemia   Pleural effusion   COPD exacerbation (HCC)    PLAN:   COPD Exacerbation, not excluding viral syndrome or Influenza:  Will check for influenza.  Will start him on Tamiflu pending results.  Continue with neb and supplemental oxygen.  Continue with IV Rocephin and Zithromax.  UTI:  BC and UC were obtained.  Will continue with IV Rocephin.   CAD stable.  Continue with meds.  DM:  Check CBG, moderate SSI  Pleural effusion:  Diastolic CHF.  EF 65%.  Careful with IVF.  Acute on chronic diastolic CHF:  Continue with oral Lasix.     DVT prophylaxis: Heparin SQ Code Status: FULL CODE.  Family Communication: None.  Disposition  Plan: Home when appropriate.  Consults called: None.  Admission status: Inpatient.    Polina Burmaster MD FACP. [facility]  If 7PM-7AM, please contact night-coverage [email or web address] Password [email or web address]  05/29/2016, 5:42 AM

## 2016-05-29 NOTE — ED Provider Notes (Signed)
[facility]-EMERGENCY DEPT Provider Note   CSN: [id number] Arrival date & time: 05/29/16  7829  Time seen 03:38 AM   History   Chief Complaint Chief Complaint  Patient presents with  . Fever  . Cough   Left foot 5 caveat for confusion  HPI Ryan [name] is a 83 y.o. male.  HPI  patient states he got sick last night. He is unable to tell me if that was this evening or the evening before. He states he's had a cough, sore throat, rhinorrhea, sneezing, and loss of appetite. He's unsure of fever. He denies nausea, vomiting, or diarrhea. He states he did take the flu shot this year. He denies being around anybody else who is ill.  PCP Kirstie Peri, MD   Past Medical History:  Diagnosis Date  . Allergic rhinitis   . CAD (coronary artery disease)    Mild nonobstructive disease 2005  . Calculus of kidney   . Colon polyp   . COPD (chronic obstructive pulmonary disease) (HCC)   . Dyslipidemia   . Essential hypertension   . Fatty liver   . History of cardiomyopathy    Normalization of LVEF on medical therapy  . History of prostatitis   . Osteoarthritis   . Right bundle branch block   . Type 2 diabetes mellitus [facility])     Patient Active Problem List   Diagnosis Date Noted  . COPD exacerbation (HCC) 05/29/2016  . Pleural effusion   . Hoarseness   . CAD (coronary artery disease)   . Ejection fraction   . Mitral regurgitation   . Pericardial effusion   . Fatty liver   . Aortic valve sclerosis   . Hypertension   . DM (diabetes mellitus) (HCC)   . Dyslipidemia   . COPD (chronic obstructive pulmonary disease) (HCC)   . Shortness of breath   . CHF (congestive heart failure) (HCC)   . Right bundle branch block     Past Surgical History:  Procedure Laterality Date  . COLONOSCOPY    . HEMICOLECTOMY         Home Medications    Prior to Admission medications   Medication Sig Start Date End Date Taking? Authorizing Provider  amLODipine (NORVASC) 10 MG tablet Take 10 mg by  mouth daily.   Yes Historical Provider, MD  aspirin 81 MG tablet Take 81 mg by mouth every evening.   Yes Historical Provider, MD  diphenhydramine-acetaminophen (TYLENOL PM) 25-500 MG TABS tablet Take 1 tablet by mouth at bedtime as needed.   Yes Historical Provider, MD  furosemide (LASIX) 40 MG tablet Take 1 tablet (40 mg total) by mouth 2 (two) times daily. Patient taking differently: Take 40 mg by mouth 2 (two) times daily.  11/01/12  Yes Luis Abed, MD  losartan (COZAAR) 100 MG tablet Take 1 tablet (100 mg total) by mouth daily. 04/14/12  Yes Luis Abed, MD  metFORMIN (GLUCOPHAGE) 500 MG tablet Take 500 mg by mouth 3 (three) times daily.  11/05/12  Yes Historical Provider, MD  metoprolol succinate (TOPROL-XL) 50 MG 24 hr tablet Take 1 tablet by mouth daily. 01/09/16  Yes Historical Provider, MD  Omega-3 Fatty Acids (FISH OIL) 1000 MG CAPS Take 1 capsule by mouth 2 (two) times daily.    Yes Historical Provider, MD  potassium chloride (KLOR-CON 10) 10 MEQ tablet Take 10 mEq by mouth 2 (two) times daily.   Yes Historical Provider, MD  simvastatin (ZOCOR) 40 MG tablet Take 40 mg by  mouth every evening.   Yes Historical Provider, MD  Tamsulosin HCl (FLOMAX) 0.4 MG CAPS Take 0.4 mg by mouth every morning.    Yes Historical Provider, MD  vitamin B-12 (CYANOCOBALAMIN) 500 MCG tablet Take 500 mcg by mouth daily.   Yes Historical Provider, MD    Family History No family history on file.  Social History Social History  Substance Use Topics  . Smoking status: Former Smoker    Packs/day: 2.50    Years: 32.00    Types: Cigarettes    Start date: 10/07/1952    Quit date: 10/23/1984  . Smokeless tobacco: Never Used  . Alcohol use No  lives at home Lives with spouse   Allergies   Patient has no known allergies.   Review of Systems Review of Systems  All other systems reviewed and are negative.    Physical Exam Updated Vital Signs BP 133/56 (BP Location: Right Arm)   Pulse 75   Temp  102.4 F (39.1 C) (Oral)   Resp 24   Ht 6' (1.829 m)   Wt 191 lb (86.6 kg)   SpO2 97%   BMI 25.90 kg/m   Vital signs normal except for fever   Physical Exam  Constitutional: He appears well-developed and well-nourished.  Non-toxic appearance. He does not appear ill. No distress.  HENT:  Head: Normocephalic and atraumatic.  Right Ear: External ear normal.  Left Ear: External ear normal.  Nose: Nose normal. No mucosal edema or rhinorrhea.  Mouth/Throat: Oropharynx is clear and moist and mucous membranes are normal. No dental abscesses or uvula swelling.  Eyes: Conjunctivae and EOM are normal. Pupils are equal, round, and reactive to light.  Neck: Normal range of motion and full passive range of motion without pain. Neck supple.  Cardiovascular: Normal rate, regular rhythm and normal heart sounds.  Exam reveals no gallop and no friction rub.   No murmur heard. Pulmonary/Chest: Effort normal and breath sounds normal. No respiratory distress. He has no wheezes. He has no rhonchi. He has no rales. He exhibits no tenderness and no crepitus.  Abdominal: Soft. Normal appearance and bowel sounds are normal. He exhibits no distension. There is no tenderness. There is no rebound and no guarding.  Musculoskeletal: Normal range of motion. He exhibits no edema or tenderness.  Moves all extremities well.   Neurological: He is alert. He has normal strength. No cranial nerve deficit.  Patient seems confused with certain questions. I am not sure if that is his baseline.  Skin: Skin is warm, dry and intact. No rash noted. No erythema. No pallor.  Psychiatric: He has a normal mood and affect. His speech is normal and behavior is normal. His mood appears not anxious.  Nursing note and vitals reviewed.    ED Treatments / Results  Labs (all labs ordered are listed, but only abnormal results are displayed) Results for orders placed or performed during the hospital encounter of 05/29/16  Blood Culture  (routine x 2)  Result Value Ref Range   Specimen Description BLOOD RIGHT ANTECUBITAL    Special Requests      BOTTLES DRAWN AEROBIC AND ANAEROBIC AEB 6CC ANA 8CC   Culture PENDING    Report Status PENDING   Blood Culture (routine x 2)  Result Value Ref Range   Specimen Description BLOOD RIGHT FOREARM    Special Requests BOTTLES DRAWN AEROBIC AND ANAEROBIC 10CC EACH    Culture PENDING    Report Status PENDING   Comprehensive metabolic panel  Result Value Ref Range   Sodium 137 135 - 145 mmol/L   Potassium 3.9 3.5 - 5.1 mmol/L   Chloride 102 101 - 111 mmol/L   CO2 23 22 - 32 mmol/L   Glucose, Bld 167 (H) 65 - 99 mg/dL   BUN 26 (H) 6 - 20 mg/dL   Creatinine, Ser 3.83 (H) 0.61 - 1.24 mg/dL   Calcium 8.5 (L) 8.9 - 10.3 mg/dL   Total Protein 7.6 6.5 - 8.1 g/dL   Albumin 4.3 3.5 - 5.0 g/dL   AST 37 15 - 41 U/L   ALT 22 17 - 63 U/L   Alkaline Phosphatase 53 38 - 126 U/L   Total Bilirubin 1.7 (H) 0.3 - 1.2 mg/dL   GFR calc non Af Amer 41 (L) >60 mL/min   GFR calc Af Amer 48 (L) >60 mL/min   Anion gap 12 5 - 15  CBC WITH DIFFERENTIAL  Result Value Ref Range   WBC 6.8 4.0 - 10.5 K/uL   RBC 4.43 4.22 - 5.81 MIL/uL   Hemoglobin 13.3 13.0 - 17.0 g/dL   HCT 33.8 32.9 - 19.1 %   MCV 90.1 78.0 - 100.0 fL   MCH 30.0 26.0 - 34.0 pg   MCHC 33.3 30.0 - 36.0 g/dL   RDW 66.0 60.0 - 45.9 %   Platelets  150 - 400 K/uL    PLATELET CLUMPS NOTED ON SMEAR, COUNT APPEARS DECREASED   Neutrophils Relative % 85 %   Neutro Abs 5.8 1.7 - 7.7 K/uL   Lymphocytes Relative 7 %   Lymphs Abs 0.5 (L) 0.7 - 4.0 K/uL   Monocytes Relative 8 %   Monocytes Absolute 0.5 0.1 - 1.0 K/uL   Eosinophils Relative 0 %   Eosinophils Absolute 0.0 0.0 - 0.7 K/uL   Basophils Relative 0 %   Basophils Absolute 0.0 0.0 - 0.1 K/uL   Smear Review      PLATELET CLUMPS NOTED ON SMEAR, COUNT APPEARS DECREASED  Urinalysis, Routine w reflex microscopic  Result Value Ref Range   Color, Urine YELLOW YELLOW   APPearance CLOUDY  (A) CLEAR   Specific Gravity, Urine 1.014 1.005 - 1.030   pH 5.0 5.0 - 8.0   Glucose, UA NEGATIVE NEGATIVE mg/dL   Hgb urine dipstick MODERATE (A) NEGATIVE   Bilirubin Urine NEGATIVE NEGATIVE   Ketones, ur 5 (A) NEGATIVE mg/dL   Protein, ur 30 (A) NEGATIVE mg/dL   Nitrite NEGATIVE NEGATIVE   Leukocytes, UA LARGE (A) NEGATIVE   RBC / HPF 6-30 0 - 5 RBC/hpf   WBC, UA TOO NUMEROUS TO COUNT 0 - 5 WBC/hpf   Bacteria, UA MANY (A) NONE SEEN   WBC Clumps PRESENT    Mucous PRESENT   Lactic acid, plasma  Result Value Ref Range   Lactic Acid, Venous 3.0 (HH) 0.5 - 1.9 mmol/L  Lactic acid, plasma  Result Value Ref Range   Lactic Acid, Venous 2.4 (HH) 0.5 - 1.9 mmol/L  Brain natriuretic peptide  Result Value Ref Range   B Natriuretic Peptide 166.0 (H) 0.0 - 100.0 pg/mL   Laboratory interpretation all normal except elevated LA, renal insufficiency, hyperglycemia, elevated Tbilirubin, UTI    EKG  EKG Interpretation  Date/Time:  Thursday May 29 2016 03:29:00 EST Ventricular Rate:  92 PR Interval:    QRS Duration: 150 QT Interval:  407 QTC Calculation: 458 R Axis:   62 Text Interpretation:  Atrial fibrillation Right bundle branch block Baseline wander Electrode noise No  old tracing to compare Confirmed by [facility]  MD-I, Amoree Newlon ([id number]) on 05/29/2016 3:39:24 AM       Radiology Dg Chest Port 1 View  Result Date: 05/29/2016 CLINICAL DATA:  Cough, fever and shortness of breath. EXAM: PORTABLE CHEST 1 VIEW COMPARISON:  Frontal and lateral views 03/14/2016 FINDINGS: Moderate-large right pleural effusion occupying the lower 1/2 of right hemithorax, likely increased from prior exam. Associated right basilar opacity. Decreased blunting of left costophrenic angle. Unchanged heart size and mediastinal contours, no mediastinal shift. Aortic arch atherosclerosis. No pneumothorax. Stable pulmonary vasculature. Bronchial thickening again seen. IMPRESSION: 1. Moderate-to-large right pleural effusion,  increased from prior exam. Likely associated atelectasis. 2. Improved left pleural effusion. 3. Bronchial thickening is unchanged. 4. Aortic arch atherosclerosis. Electronically Signed   By: Rubye Oaks M.D.   On: 05/29/2016 04:21    Procedures Procedures (including critical care time)  Medications Ordered in ED Medications  azithromycin (ZITHROMAX) 500 mg in dextrose 5 % 250 mL IVPB (not administered)  cefTRIAXone (ROCEPHIN) 2 g in dextrose 5 % 50 mL IVPB (not administered)  oseltamivir (TAMIFLU) capsule 30 mg (30 mg Oral Given 05/29/16 0708)  sodium chloride 0.9 % bolus 1,000 mL (0 mLs Intravenous Stopped 05/29/16 0501)    And  sodium chloride 0.9 % bolus 1,000 mL (0 mLs Intravenous Stopped 05/29/16 0618)  cefTRIAXone (ROCEPHIN) 1 g in dextrose 5 % 50 mL IVPB (0 g Intravenous Stopped 05/29/16 0500)  azithromycin (ZITHROMAX) 500 mg in dextrose 5 % 250 mL IVPB (0 mg Intravenous Stopped 05/29/16 0558)     Initial Impression / Assessment and Plan / ED Course  I have reviewed the triage vital signs and the nursing notes.  Pertinent labs & imaging results that were available during my care of the patient were reviewed by me and considered in my medical decision making (see chart for details).   patient was started on sepsis protocol without calling code sepsis. In addition patient was also tested for influenza. With his symptoms I suspect he could have pneumonia. He was started on community-acquired pneumonia antibiotics after looking to see if he's had any recent admissions which he has not.  I reviewed his prior CXR's, he had one in November 2017 which had the right pleural effusion, and the CXR before that was in 2013 and he did not have the effusion.   04:50 AM After reviewing his labs, he was given an addition dose of rocephin IV for a total of 2 mg for his UTI.  05:12 AM Dr Conley Rolls, hospitalist, will admit  Final Clinical Impressions(s) / ED Diagnoses   Final diagnoses:  Fever, unspecified  fever cause  Urinary tract infection without hematuria, site unspecified  Confusion    Plan admission  Devoria Albe, MD, Concha Pyo, MD 05/29/16 [phone]

## 2016-05-29 NOTE — Progress Notes (Signed)
Pharmacy Antibiotic Note  Ryan [name] is a 83 y.o. male admitted on 05/29/2016 with UTI/COPD exacerbation.  Pharmacy has been consulted for Levaquin dosing.  Plan: Levaquin 750mg  IV q48h F/U cultures and clinical progress Monitor V/S and labs  Height: 6' (182.9 cm) Weight: 196 lb (88.9 kg) IBW/kg (Calculated) : 77.6  Temp (24hrs), Avg:100 F (37.8 C), Min:98.6 F (37 C), Max:102.4 F (39.1 C)   Recent Labs Lab 05/29/16 0333 05/29/16 0637  WBC 6.8  --   CREATININE 1.52*  --   LATICACIDVEN 3.0* 2.4*    Estimated Creatinine Clearance: 41.8 mL/min (by C-G formula based on SCr of 1.52 mg/dL (H)).    No Known Allergies  Antimicrobials this admission: Levaquin 2/1>> Ceftriaxone 2/1 x 1 dose in ED Azithromycin 2/1 x 1 dose in ED  Microbiology results: 2/1 BCx: pending 2/1 UCx: pending 2/1 Influenzae : positive  Thank you for allowing pharmacy to be a part of this patient's care.  Elder Cyphers, BS Pharm D, [place] Clinical Pharmacist Pager [phone] 05/29/2016 2:27 PM

## 2016-05-29 NOTE — Progress Notes (Signed)
PROGRESS NOTE    Ryan [name]  ZOX:[id number] DOB: 1932-07-05 DOA: 05/29/2016 PCP: Kirstie Peri, MD   Brief Narrative:  83 yo male with COPD, presents with worsening dyspnea. On the initial physical examination, patient with tachypnea at 34 breaths per minute. Significant abnormal lung examination with rhonchi and rales, chest film with bilateral pleural effusions, influenza A positive and urine analysis positive for urine infection. Patient started on bronchodilator therapy, antibiotics and admitted for further evaluation.    Assessment & Plan:   Principal Problem:   COPD (chronic obstructive pulmonary disease) (HCC) Active Problems:   CAD (coronary artery disease)   Hypertension   DM (diabetes mellitus) (HCC)   Dyslipidemia   Pleural effusion   COPD exacerbation (HCC)   1. COPD exacerbation. Will continue bronchodilator therapy with duonebs and systemic steroids. Continue oxymetry monitoring and supplemental 02 per [place] to target 02 sat above 92%. Antibiotic therapy with levofloxacin. Continue guaifenesin.   2. Bilateral pleural effusion. Chest film personally reviewed, noted bilateral pleural effusions, on the right has been chronic when compared with old films, suspected right hemidiaphragm elevation, will order non contrasted chest CT to evaluate right lower lobe.    3. Acute influenza A. Will continue oseltamivir, droplet precautions.   4. Urine tract infection. Follow on cultures, will continue antibiotic therapy with levofloxacin, follow on renal US. Continue flomax.   5. T2DM. Will continue glucose cove and monitoring with iss. Capillary glucose before meals.   6. CAD. No chest pain continue asa and statin therapy.   7. HTN. Will continue amlodipine, furosemide and metoprolol.    DVT prophylaxis: heparin  Code Status: full  Family Communication: no family at bedside  Disposition Plan: home   Consultants:     Procedures:    Antimicrobials:  Levofloxacin.     Subjective: Positive for dyspnea, moderate to severe in intensity, associated with wheezing and cough, worse with exertion, improved with nebulized medications. Quit smoking 30 years ago, before that 3 packs per day.   Objective: Vitals:   05/29/16 0900 05/29/16 0930 05/29/16 1000 05/29/16 1215  BP: (!) 104/46 (!) 106/47 (!) 111/52 131/64  Pulse: 65   76  Resp: 26 23 20 22   Temp:      TempSrc:      SpO2: 99%   99%  Weight:      Height:        Intake/Output Summary (Last 24 hours) at 05/29/16 1221 Last data filed at 05/29/16 8119  Gross per 24 hour  Intake             2300 ml  Output                0 ml  Net             2300 ml   Filed Weights   05/29/16 0333  Weight: 86.6 kg (191 lb)    Examination:  General exam: deconditioned and ill looking appearing E ENT: no pallor or icterus, oral mucosa moist.  Respiratory system: decreased breath sounds bilaterally with hypoventilation, positive scattered rales and diffuse bilateral rhonchi.  Cardiovascular system: S1 & S2 heard, RRR. No JVD, murmurs, rubs, gallops or clicks. Trace pedal edema. Gastrointestinal system: Abdomen is nondistended, soft and nontender. No organomegaly or masses felt. Normal bowel sounds heard. Central nervous system: Alert and oriented. No focal neurological deficits. Extremities: Symmetric 5 x 5 power. Skin: No rashes, lesions or ulcers  Data Reviewed: I have personally reviewed following labs and imaging studies  CBC:  Recent Labs Lab 05/29/16 0333  WBC 6.8  NEUTROABS 5.8  HGB 13.3  HCT 39.9  MCV 90.1  PLT PLATELET CLUMPS NOTED ON SMEAR, COUNT APPEARS DECREASED   Basic Metabolic Panel:  Recent Labs Lab 05/29/16 0333  NA 137  K 3.9  CL 102  CO2 23  GLUCOSE 167*  BUN 26*  CREATININE 1.52*  CALCIUM 8.5*   GFR: Estimated Creatinine Clearance: 41.8 mL/min (by C-G formula based on SCr of 1.52 mg/dL (H)). Liver Function Tests:  Recent Labs Lab 05/29/16 0333  AST 37  ALT 22   ALKPHOS 53  BILITOT 1.7*  PROT 7.6  ALBUMIN 4.3   No results for input(s): LIPASE, AMYLASE in the last 168 hours. No results for input(s): AMMONIA in the last 168 hours. Coagulation Profile: No results for input(s): INR, PROTIME in the last 168 hours. Cardiac Enzymes: No results for input(s): CKTOTAL, CKMB, CKMBINDEX, TROPONINI in the last 168 hours. BNP (last 3 results) No results for input(s): PROBNP in the last 8760 hours. HbA1C: No results for input(s): HGBA1C in the last 72 hours. CBG: No results for input(s): GLUCAP in the last 168 hours. Lipid Profile: No results for input(s): CHOL, HDL, LDLCALC, TRIG, CHOLHDL, LDLDIRECT in the last 72 hours. Thyroid Function Tests: No results for input(s): TSH, T4TOTAL, FREET4, T3FREE, THYROIDAB in the last 72 hours. Anemia Panel: No results for input(s): VITAMINB12, FOLATE, FERRITIN, TIBC, IRON, RETICCTPCT in the last 72 hours. Sepsis Labs:  Recent Labs Lab 05/29/16 0333 05/29/16 8832  LATICACIDVEN 3.0* 2.4*    Recent Results (from the past 240 hour(s))  Blood Culture (routine x 2)     Status: None (Preliminary result)   Collection Time: 05/29/16  3:33 AM  Result Value Ref Range Status   Specimen Description BLOOD RIGHT ANTECUBITAL  Final   Special Requests   Final    BOTTLES DRAWN AEROBIC AND ANAEROBIC AEB 6CC ANA 8CC   Culture NO GROWTH < 12 HOURS  Final   Report Status PENDING  Incomplete  Blood Culture (routine x 2)     Status: None (Preliminary result)   Collection Time: 05/29/16  4:05 AM  Result Value Ref Range Status   Specimen Description BLOOD RIGHT FOREARM  Final   Special Requests BOTTLES DRAWN AEROBIC AND ANAEROBIC 10CC EACH  Final   Culture NO GROWTH < 12 HOURS  Final   Report Status PENDING  Incomplete         Radiology Studies: Dg Chest Port 1 View  Result Date: 05/29/2016 CLINICAL DATA:  Cough, fever and shortness of breath. EXAM: PORTABLE CHEST 1 VIEW COMPARISON:  Frontal and lateral views 03/14/2016  FINDINGS: Moderate-large right pleural effusion occupying the lower 1/2 of right hemithorax, likely increased from prior exam. Associated right basilar opacity. Decreased blunting of left costophrenic angle. Unchanged heart size and mediastinal contours, no mediastinal shift. Aortic arch atherosclerosis. No pneumothorax. Stable pulmonary vasculature. Bronchial thickening again seen. IMPRESSION: 1. Moderate-to-large right pleural effusion, increased from prior exam. Likely associated atelectasis. 2. Improved left pleural effusion. 3. Bronchial thickening is unchanged. 4. Aortic arch atherosclerosis. Electronically Signed   By: Rubye Oaks M.D.   On: 05/29/2016 04:21        Scheduled Meds: . acetaminophen  650 mg Oral Once  . amLODipine  10 mg Oral Daily  . aspirin  81 mg Oral QPM  . furosemide  40 mg Oral BID  . heparin  5,000 Units Subcutaneous Q8H  . metFORMIN  500 mg Oral  TID  . metoprolol succinate  50 mg Oral Daily  . omega-3 acid ethyl esters  1 g Oral BID  . oseltamivir  30 mg Oral BID  . simvastatin  40 mg Oral QPM  . tamsulosin  0.4 mg Oral q morning - 10a   Continuous Infusions: . [START ON 05/30/2016] azithromycin    . cefTRIAXone (ROCEPHIN)  IV    . [START ON 05/30/2016] cefTRIAXone (ROCEPHIN)  IV       LOS: 0 days      Alesana Magistro Annett Gula, MD [facility] Pager [phone]  If 7PM-7AM, please contact night-coverage [email or web address] Password [email or web address] 05/29/2016, 12:21 PM

## 2016-05-30 ENCOUNTER — Inpatient Hospital Stay (HOSPITAL_COMMUNITY): Payer: Medicare Other

## 2016-05-30 DIAGNOSIS — I25119 Atherosclerotic heart disease of native coronary artery with unspecified angina pectoris: Secondary | ICD-10-CM

## 2016-05-30 DIAGNOSIS — I10 Essential (primary) hypertension: Secondary | ICD-10-CM

## 2016-05-30 DIAGNOSIS — J449 Chronic obstructive pulmonary disease, unspecified: Secondary | ICD-10-CM

## 2016-05-30 DIAGNOSIS — J90 Pleural effusion, not elsewhere classified: Secondary | ICD-10-CM

## 2016-05-30 DIAGNOSIS — J441 Chronic obstructive pulmonary disease with (acute) exacerbation: Secondary | ICD-10-CM

## 2016-05-30 DIAGNOSIS — E785 Hyperlipidemia, unspecified: Secondary | ICD-10-CM

## 2016-05-30 LAB — GLUCOSE, CAPILLARY
GLUCOSE-CAPILLARY: 185 mg/dL — AB (ref 65–99)
GLUCOSE-CAPILLARY: 176 mg/dL — AB (ref 65–99)
Glucose-Capillary: 295 mg/dL — ABNORMAL HIGH (ref 65–99)
Glucose-Capillary: 210 mg/dL — ABNORMAL HIGH (ref 65–99)

## 2016-05-30 LAB — CBC WITH DIFFERENTIAL/PLATELET
BASOS PCT: 0 %
Basophils Absolute: 0 10*3/uL (ref 0.0–0.1)
EOS ABS: 0 10*3/uL (ref 0.0–0.7)
Eosinophils Relative: 0 %
HEMATOCRIT: 34.6 % — AB (ref 39.0–52.0)
HEMOGLOBIN: 11.6 g/dL — AB (ref 13.0–17.0)
LYMPHS ABS: 0.4 10*3/uL — AB (ref 0.7–4.0)
Lymphocytes Relative: 5 %
MCH: 30.1 pg (ref 26.0–34.0)
MCHC: 33.5 g/dL (ref 30.0–36.0)
MCV: 89.9 fL (ref 78.0–100.0)
MONOS PCT: 5 %
Monocytes Absolute: 0.3 10*3/uL (ref 0.1–1.0)
NEUTROS ABS: 6.4 10*3/uL (ref 1.7–7.7)
NEUTROS PCT: 91 %
Platelets: 84 10*3/uL — ABNORMAL LOW (ref 150–400)
RBC: 3.85 MIL/uL — AB (ref 4.22–5.81)
RDW: 15.1 % (ref 11.5–15.5)
WBC: 7.1 10*3/uL (ref 4.0–10.5)

## 2016-05-30 LAB — CBC
HEMATOCRIT: 34.6 % — AB (ref 39.0–52.0)
HEMOGLOBIN: 11.8 g/dL — AB (ref 13.0–17.0)
MCH: 30.5 pg (ref 26.0–34.0)
MCHC: 34.1 g/dL (ref 30.0–36.0)
MCV: 89.4 fL (ref 78.0–100.0)
Platelets: 86 10*3/uL — ABNORMAL LOW (ref 150–400)
RBC: 3.87 MIL/uL — ABNORMAL LOW (ref 4.22–5.81)
RDW: 15 % (ref 11.5–15.5)
WBC: 5.5 10*3/uL (ref 4.0–10.5)

## 2016-05-30 LAB — COMPREHENSIVE METABOLIC PANEL
ALBUMIN: 3.6 g/dL (ref 3.5–5.0)
ALK PHOS: 42 U/L (ref 38–126)
ALT: 26 U/L (ref 17–63)
AST: 58 U/L — AB (ref 15–41)
Anion gap: 11 (ref 5–15)
BUN: 28 mg/dL — AB (ref 6–20)
CALCIUM: 8.4 mg/dL — AB (ref 8.9–10.3)
CO2: 22 mmol/L (ref 22–32)
Chloride: 103 mmol/L (ref 101–111)
Creatinine, Ser: 1.25 mg/dL — ABNORMAL HIGH (ref 0.61–1.24)
GFR calc Af Amer: >60 mL/min (ref >60)
GFR calc non Af Amer: 52 mL/min — ABNORMAL LOW (ref >60)
GLUCOSE: 333 mg/dL — AB (ref 65–99)
Potassium: 3.7 mmol/L (ref 3.5–5.1)
SODIUM: 136 mmol/L (ref 135–145)
Total Bilirubin: 0.7 mg/dL (ref 0.3–1.2)
Total Protein: 6.5 g/dL (ref 6.5–8.1)

## 2016-05-30 LAB — LACTIC ACID, PLASMA
LACTIC ACID, VENOUS: 3.8 mmol/L — AB (ref 0.5–1.9)
LACTIC ACID, VENOUS: 4 mmol/L — AB (ref 0.5–1.9)
LACTIC ACID, VENOUS: 4.1 mmol/L — AB (ref 0.5–1.9)
Lactic Acid, Venous: 4.3 mmol/L (ref 0.5–1.9)

## 2016-05-30 LAB — URINE CULTURE

## 2016-05-30 MED ORDER — INSULIN ASPART 100 UNIT/ML ~~LOC~~ SOLN
0.0000 [IU] | Freq: Three times a day (TID) | SUBCUTANEOUS | Status: DC
  Administered 2016-05-30 – 2016-05-31 (×2): 4 [IU] via SUBCUTANEOUS

## 2016-05-30 MED ORDER — INSULIN ASPART 100 UNIT/ML ~~LOC~~ SOLN: 5.0000 [IU] | Freq: Three times a day (TID) | SUBCUTANEOUS | Status: DC

## 2016-05-30 MED ORDER — INSULIN ASPART 100 UNIT/ML ~~LOC~~ SOLN: 10.0000 [IU] | Freq: Three times a day (TID) | SUBCUTANEOUS | Status: DC

## 2016-05-30 MED ORDER — INSULIN GLARGINE 100 UNIT/ML ~~LOC~~ SOLN
20.0000 [IU] | Freq: Every day | SUBCUTANEOUS | Status: DC
  Administered 2016-05-30: 20 [IU] via SUBCUTANEOUS
  Filled 2016-05-30 (×3): qty 0.2

## 2016-05-30 MED ORDER — SODIUM CHLORIDE 0.9 % IV BOLUS (SEPSIS)
1000.0000 mL | Freq: Once | INTRAVENOUS | Status: AC
  Administered 2016-05-30: 1000 mL via INTRAVENOUS

## 2016-05-30 MED ORDER — IPRATROPIUM-ALBUTEROL 0.5-2.5 (3) MG/3ML IN SOLN
3.0000 mL | Freq: Three times a day (TID) | RESPIRATORY_TRACT | Status: DC
  Administered 2016-05-30 – 2016-05-31 (×3): 3 mL via RESPIRATORY_TRACT
  Filled 2016-05-30 (×3): qty 3

## 2016-05-30 MED ORDER — INSULIN ASPART 100 UNIT/ML ~~LOC~~ SOLN
0.0000 [IU] | Freq: Every day | SUBCUTANEOUS | Status: DC
  Administered 2016-05-30: 2 [IU] via SUBCUTANEOUS

## 2016-05-30 MED ORDER — INSULIN GLARGINE 100 UNIT/ML ~~LOC~~ SOLN
10.0000 [IU] | Freq: Every day | SUBCUTANEOUS | Status: DC
  Filled 2016-05-30 (×3): qty 0.1

## 2016-05-30 NOTE — Progress Notes (Signed)
Inpatient Diabetes Program Recommendations  AACE/ADA: New Consensus Statement on Inpatient Glycemic Control (2015)  Target Ranges:  Prepandial:   less than 140 mg/dL      Peak postprandial:   less than 180 mg/dL (1-2 hours)      Critically ill patients:  140 - 180 mg/dL   Results for MARKUS, ARMINIO (MRN [id number]) as of 05/30/2016 12:14  Ref. Range 05/29/2016 16:25 05/29/2016 22:17 05/30/2016 07:39 05/30/2016 11:30  Glucose-Capillary Latest Ref Range: 65 - 99 mg/dL 976 (H) 734 (H) 193 (H) 295 (H)    Admit with: SOB  History: DM, COPD  Home DM Meds: Metformin 500 mg TID  Current Insulin Orders: Novolog Sensitive Correction Scale/ SSI (0-9 units) TID AC      MD- Note patient getting Solumedrol 40 mg Q8 hours.  Glucose levels elevated.  Please consider the following adjustments while patient getting steroids:  1. Start Lantus 9 units daily (0.1 units/kg based on weight of 89 kg)  2. Start Novolog Meal Coverage: Novolog 4 units TID with meals (hold if pt eats <50% of meal)     --Will follow patient during hospitalization--  Ambrose Finland RN, MSN, CDE Diabetes Coordinator Inpatient Glycemic Control Team Team Pager: [phone] (8a-5p)

## 2016-05-30 NOTE — Progress Notes (Signed)
PROGRESS NOTE    Ryan [name]  WFU:[id number] DOB: Jun 14, 1932 DOA: 05/29/2016 PCP: Kirstie Peri, MD   Brief Narrative:  83 yo male with COPD, presents with worsening dyspnea. On the initial physical examination, patient with tachypnea at 34 breaths per minute. Significant abnormal lung examination with rhonchi and rales, chest film with bilateral pleural effusions, influenza A positive and urine analysis positive for urine infection. Patient started on bronchodilator therapy, antibiotics and admitted for further evaluation.    Assessment & Plan:   Principal Problem:   COPD (chronic obstructive pulmonary disease) (HCC) Active Problems:   CAD (coronary artery disease)   Hypertension   DM (diabetes mellitus) (HCC)   Dyslipidemia   Pleural effusion   COPD exacerbation (HCC)   1. COPD exacerbation. Will continue bronchodilator therapy with duonebs and systemic steroids. Continue oxymetry monitoring and supplemental 02 per [place] to target 02 sat above 92%. Antibiotic therapy with levofloxacin. Continue guaifenesin. Clinically starting to improve.    2. Bilateral pleural effusion. Chest film personally reviewed, noted bilateral pleural effusions, on the right has been chronic when compared with old films, suspected right hemidiaphragm elevation,  non contrasted chest CT to evaluate right lower lobe still pending.    3. Acute influenza A. Will continue oseltamivir, droplet precautions.   4. Urine tract infection. Follow on cultures, will continue antibiotic therapy with levofloxacin, follow on renal US. Continue flomax.   5. T2DM. Will continue glucose cove and monitoring with iss. Capillary glucose before meals.   6. CAD. No chest pain continue asa and statin therapy.   7. HTN. Will continue amlodipine, furosemide and metoprolol.  8. Elevated lactic acid - bolus IVF stat and recheck lactic acid within 6 hours.     DVT prophylaxis: heparin  Code Status: full  Family Communication: no  family at bedside  Disposition Plan: home   Consultants:     Procedures:    Antimicrobials:  Levofloxacin.   Subjective: Pt reports that he is starting to feel much better.  He still gets SOB with ambulation requiring oxygen but it is being slowly weaned down.    Objective: Vitals:   05/29/16 2220 05/30/16 0300 05/30/16 0548 05/30/16 0813  BP: (!) 115/48  (!) 124/59   Pulse: 73  76   Resp: 20  18   Temp: 98.5 F (36.9 C)  97.7 F (36.5 C)   TempSrc: Oral  Oral   SpO2: 97% 95% 98% 97%  Weight:      Height:        Intake/Output Summary (Last 24 hours) at 05/30/16 1306 Last data filed at 05/30/16 0900  Gross per 24 hour  Intake              740 ml  Output              950 ml  Net             -210 ml   Filed Weights   05/29/16 0333 05/29/16 1227  Weight: 86.6 kg (191 lb) 88.9 kg (196 lb)    Examination:  General exam: deconditioned and ill looking appearing E ENT: no pallor or icterus, oral mucosa moist.  Respiratory system: diminshed breath sounds bilaterally, no increased WOB Cardiovascular system: S1 & S2 heard. No JVD, murmurs, rubs, gallops or clicks. Trace pedal edema. Gastrointestinal system: Abdomen is nondistended, soft and nontender. No organomegaly or masses felt. Normal bowel sounds heard. Central nervous system: Alert and oriented. No focal neurological deficits. Extremities: Symmetric 5 x  5 power. Skin: No rashes, lesions or ulcers  Data Reviewed: I have personally reviewed following labs and imaging studies  CBC:  Recent Labs Lab 05/29/16 0333 05/30/16 1031  WBC 6.8 5.5  NEUTROABS 5.8  --   HGB 13.3 11.8*  HCT 39.9 34.6*  MCV 90.1 89.4  PLT PLATELET CLUMPS NOTED ON SMEAR, COUNT APPEARS DECREASED 86*   Basic Metabolic Panel:  Recent Labs Lab 05/29/16 0333 05/30/16 1031  NA 137 136  K 3.9 3.7  CL 102 103  CO2 23 22  GLUCOSE 167* 333*  BUN 26* 28*  CREATININE 1.52* 1.25*  CALCIUM 8.5* 8.4*   GFR: Estimated Creatinine  Clearance: 50.9 mL/min (by C-G formula based on SCr of 1.25 mg/dL (H)). Liver Function Tests:  Recent Labs Lab 05/29/16 0333 05/30/16 1031  AST 37 58*  ALT 22 26  ALKPHOS 53 42  BILITOT 1.7* 0.7  PROT 7.6 6.5  ALBUMIN 4.3 3.6   No results for input(s): LIPASE, AMYLASE in the last 168 hours. No results for input(s): AMMONIA in the last 168 hours. Coagulation Profile: No results for input(s): INR, PROTIME in the last 168 hours. Cardiac Enzymes: No results for input(s): CKTOTAL, CKMB, CKMBINDEX, TROPONINI in the last 168 hours. BNP (last 3 results) No results for input(s): PROBNP in the last 8760 hours. HbA1C: No results for input(s): HGBA1C in the last 72 hours. CBG:  Recent Labs Lab 05/29/16 1625 05/29/16 2217 05/30/16 0739 05/30/16 1130  GLUCAP 188* 285* 185* 295*   Lipid Profile: No results for input(s): CHOL, HDL, LDLCALC, TRIG, CHOLHDL, LDLDIRECT in the last 72 hours. Thyroid Function Tests: No results for input(s): TSH, T4TOTAL, FREET4, T3FREE, THYROIDAB in the last 72 hours. Anemia Panel: No results for input(s): VITAMINB12, FOLATE, FERRITIN, TIBC, IRON, RETICCTPCT in the last 72 hours. Sepsis Labs:  Recent Labs Lab 05/29/16 1610 05/29/16 0637 05/30/16 1031  LATICACIDVEN 3.0* 2.4* 3.8*    Recent Results (from the past 240 hour(s))  Blood Culture (routine x 2)     Status: None (Preliminary result)   Collection Time: 05/29/16  3:33 AM  Result Value Ref Range Status   Specimen Description BLOOD RIGHT ANTECUBITAL  Final   Special Requests   Final    BOTTLES DRAWN AEROBIC AND ANAEROBIC AEB 6CC ANA 8CC   Culture NO GROWTH 1 DAY  Final   Report Status PENDING  Incomplete  Urine culture     Status: Abnormal   Collection Time: 05/29/16  3:49 AM  Result Value Ref Range Status   Specimen Description URINE, RANDOM  Final   Special Requests NONE  Final   Culture MULTIPLE SPECIES PRESENT, SUGGEST RECOLLECTION (A)  Final   Report Status 05/30/2016 FINAL  Final    Blood Culture (routine x 2)     Status: None (Preliminary result)   Collection Time: 05/29/16  4:05 AM  Result Value Ref Range Status   Specimen Description BLOOD RIGHT FOREARM  Final   Special Requests BOTTLES DRAWN AEROBIC AND ANAEROBIC 10CC EACH  Final   Culture NO GROWTH 1 DAY  Final   Report Status PENDING  Incomplete    Radiology Studies: US Renal  Result Date: 05/29/2016 CLINICAL DATA:  Urinary tract infection. History of diabetes, coronary artery disease. EXAM: RENAL / URINARY TRACT ULTRASOUND COMPLETE COMPARISON:  None in PACs FINDINGS: Right Kidney: Length: 11.7 cm. The renal cortical echotexture is lower than that of the adjacent liver. There is mild diffuse cortical thinning. There is no hydronephrosis. Left Kidney: Length:  12.7 cm. The renal cortical echotexture is similar to that on the right. There is mild cortical thinning. Bladder: The partially distended urinary bladder is unremarkable. Ureteral jets were not observed. Incidental note is made of gallstones. No positive sonographic Eulah Pont sign was reported. IMPRESSION: Mild renal cortical atrophy bilaterally. Preserved renal cortical echotexture. No hydronephrosis. Gallstones. Electronically Signed   By: Byrd  [place] M.D.   On: 05/29/2016 13:31   Dg Chest Port 1 View  Result Date: 05/29/2016 CLINICAL DATA:  Cough, fever and shortness of breath. EXAM: PORTABLE CHEST 1 VIEW COMPARISON:  Frontal and lateral views 03/14/2016 FINDINGS: Moderate-large right pleural effusion occupying the lower 1/2 of right hemithorax, likely increased from prior exam. Associated right basilar opacity. Decreased blunting of left costophrenic angle. Unchanged heart size and mediastinal contours, no mediastinal shift. Aortic arch atherosclerosis. No pneumothorax. Stable pulmonary vasculature. Bronchial thickening again seen. IMPRESSION: 1. Moderate-to-large right pleural effusion, increased from prior exam. Likely associated atelectasis. 2. Improved left  pleural effusion. 3. Bronchial thickening is unchanged. 4. Aortic arch atherosclerosis. Electronically Signed   By: Rubye Oaks M.D.   On: 05/29/2016 04:21   Scheduled Meds: . acetaminophen  650 mg Oral Once  . amLODipine  10 mg Oral Daily  . aspirin  81 mg Oral QPM  . atorvastatin  20 mg Oral q1800  . furosemide  40 mg Oral BID  . heparin  5,000 Units Subcutaneous Q8H  . insulin aspart  0-20 Units Subcutaneous TID WC  . insulin aspart  0-5 Units Subcutaneous QHS  . insulin aspart  5 Units Subcutaneous TID WC  . ipratropium-albuterol  3 mL Nebulization TID  . levofloxacin (LEVAQUIN) IV  750 mg Intravenous Q48H  . methylPREDNISolone (SOLU-MEDROL) injection  40 mg Intravenous Q8H  . metoprolol succinate  50 mg Oral Daily  . omega-3 acid ethyl esters  1 g Oral BID  . oseltamivir  30 mg Oral BID  . sodium chloride  1,000 mL Intravenous Once  . tamsulosin  0.4 mg Oral q morning - 10a   Continuous Infusions:   LOS: 1 day   Standley Dakins, MD [facility] Pager [phone]  If 7PM-7AM, please contact night-coverage [email or web address] Password [email or web address] 05/30/2016, 1:06 PM

## 2016-05-30 NOTE — Progress Notes (Signed)
**Note De-Identified  Obfuscation** RT assessment: RN asked for patient assessment due to deterioration over the last few hours with increased WOB, cardiac wheeze with crackles, +3 edema in extremities.SAT 100% on 2L.Patient would benefit from BIPAP.  RRT to continue to monitor.

## 2016-05-30 NOTE — Care Management Note (Addendum)
Case Management Note  Patient Details  Name: Ryan [name] MRN: [id number] Date of Birth: 12-22-1932  Subjective/Objective:                  Pt is from home, he lives with his wife and is ind with ADL's. He has PCP, drives himself to appointments. He has insurance and no difficulty affording or managing his medications. He uses a walker with ambulation. He is on supplemental oxygen now but not PTA. He does have COPD but not on nebs at home PTA.   Action/Plan: Pt planning to return home with self care at DC.  Will need home oxygen assessment if pt unable to wean from oxygen.   Expected Discharge Date:  05/31/16               Expected Discharge Plan:  Home/Self Care  In-House Referral:  NA  Discharge planning Services  CM Consult  Post Acute Care Choice:  NA Choice offered to:  NA  Status of Service:  Will cont to follow.   Malcolm Metro, RN 05/30/2016, 12:10 PM

## 2016-05-30 NOTE — Care Management Important Message (Signed)
Important Message  Patient Details  Name: Ryan [name] MRN: [id number] Date of Birth: 1932-07-02   Medicare Important Message Given:  Yes    Malcolm Metro, RN 05/30/2016, 12:18 PM

## 2016-05-30 NOTE — Progress Notes (Signed)
Bladder scan completed per order.  Approximately 2ml fluid in bladder.

## 2016-05-31 DIAGNOSIS — E0810 Diabetes mellitus due to underlying condition with ketoacidosis without coma: Secondary | ICD-10-CM

## 2016-05-31 LAB — GLUCOSE, CAPILLARY
GLUCOSE-CAPILLARY: 178 mg/dL — AB (ref 65–99)
GLUCOSE-CAPILLARY: 173 mg/dL — AB (ref 65–99)
Glucose-Capillary: 157 mg/dL — ABNORMAL HIGH (ref 65–99)

## 2016-05-31 LAB — BLOOD GAS, ARTERIAL
Acid-base deficit: 4.1 mmol/L — ABNORMAL HIGH (ref 0.0–2.0)
BICARBONATE: 21.4 mmol/L (ref 20.0–28.0)
Drawn by: 277331
FIO2: 1
O2 CONTENT: 15 L/min
O2 Saturation: 99.4 %
PH ART: 7.404 (ref 7.350–7.450)
PO2 ART: 233 mmHg — AB (ref 83.0–108.0)
Patient temperature: 37
pCO2 arterial: 32.3 mmHg (ref 32.0–48.0)

## 2016-05-31 LAB — LACTIC ACID, PLASMA: Lactic Acid, Venous: 1.8 mmol/L (ref 0.5–1.9)

## 2016-05-31 MED ORDER — LEVOFLOXACIN 500 MG PO TABS: 500.0000 mg | ORAL_TABLET | Freq: Every day | ORAL | 0 refills | Status: DC

## 2016-05-31 MED ORDER — QUETIAPINE FUMARATE 25 MG PO TABS: 50.0000 mg | ORAL_TABLET | Freq: Every evening | ORAL | Status: DC | PRN

## 2016-05-31 MED ORDER — PREDNISONE 20 MG PO TABS: ORAL_TABLET | ORAL | 0 refills | Status: DC

## 2016-05-31 MED ORDER — METHYLPREDNISOLONE SODIUM SUCC 40 MG IJ SOLR: 40.0000 mg | INTRAMUSCULAR | Status: DC

## 2016-05-31 MED ORDER — INSULIN ASPART 100 UNIT/ML ~~LOC~~ SOLN: 5.0000 [IU] | Freq: Three times a day (TID) | SUBCUTANEOUS | Status: DC

## 2016-05-31 MED ORDER — INSULIN GLARGINE 100 UNIT/ML ~~LOC~~ SOLN
15.0000 [IU] | Freq: Every day | SUBCUTANEOUS | Status: DC
  Filled 2016-05-31 (×2): qty 0.15

## 2016-05-31 NOTE — Progress Notes (Signed)
**Note De-Identified  Obfuscation** Patient placed on 100% NR per Dr. Conley Rolls plan of care.  RRT to continue to monitor.

## 2016-05-31 NOTE — Discharge Summary (Signed)
Physician Discharge Summary  Ryan [name] ZOX:[id number] DOB: [birth year] DOA: 05/29/2016  PCP: Kirstie Peri, MD  Admit date: 05/29/2016 Discharge date: 05/31/2016  Admitted From: Home  Disposition:  Home   Recommendations for Outpatient Follow-up:  1. Follow up with PCP in 1 weeks 2. Please obtain BMP/CBC in one week  Discharge Condition STABLE  CODE STATUS: FULL   Brief/Interim Summary: HPI: Ryan [name] is an 83 y.o. male with hx of known CAD, COPD, DM2, HLD, HTN, hx of pleural effusion, lives at home with his wife, presents to the ER with congestion, non productive coughs, loss of appetite, and sneezing.  He denied CP, fever, chills, or myalgia.  No distant travel or ill contact.  Work up in the ER included CXR which showed moderately large right pleural effusion, and improved left pleural effusion, and no infiltrate.  UA positive for UTI with TNTC Wbc's, Cr of 1.5, and negative troponin.  His lactic acid was elevated to 4, no hypotension.  He has no leukocytosis.  He was given 2 grams of Rocephin and Zithromax, and hospitalist was asked to admit him for further work up.   1. COPD exacerbation. Clinically improving, weaned down to room air.  Will continue bronchodilator therapy with duonebs and reducing steroids. Continue oxymetry monitoring. Antibiotic therapy with levofloxacin. Continue guaifenesin. Clinically starting to improve.    2. Bilateral pleural effusion. Chest film personally reviewed, noted bilateral pleural effusions, on the right has been chronic when compared with old films, suspected right hemidiaphragm elevation,  non contrasted chest CT shows RLL pneumonia.    3. Acute influenza A. Treated with oseltamivir, droplet precautions.   4. Urine tract infection. Multiple species seen on culture, no significant findings.   5. T2DM. Blood sugars much improved, following. Weaning steroids as above.    6. CAD. No chest pain continue asa and statin therapy.   7. HTN. Will  continue amlodipine, furosemide and metoprolol.  8. Elevated lactic acid - bolused IVF 2/2 and repeat lactate within normal limits.    9.  Sundowning/acute delirium - continue to try and orient patient, asked for family to come in and sit with patient to help keep him oriented, sitter if needed.  : update: spoke with wife at bedside who prefers to take him home and does not want placement.  She did not want to keep in hospital another night. She says that he usually gets confused with each hospitalization and does much better when he gets home to his stable home environment.  I told her that she could always bring him back or call 911 if he becomes too difficult to handle or if she has any specific problems that need hospital care or greater care than she can handle.  She verbalized understanding.    DVT prophylaxis: heparin  Code Status: full  Family Communication: family  Disposition Plan:  home   Procedures:    Antimicrobials:  Levofloxacin.    Discharge Diagnoses:  Principal Problem:   COPD (chronic obstructive pulmonary disease) (HCC) Active Problems:   CAD (coronary artery disease)   Hypertension   DM (diabetes mellitus) (HCC)   Dyslipidemia   Pleural effusion   COPD exacerbation [facility])  Discharge Instructions  Discharge Instructions    Increase activity slowly    Complete by:  As directed      Allergies as of 05/31/2016   No Known Allergies     Medication List    TAKE these medications   amLODipine 10 MG tablet Commonly  known as:  NORVASC Take 10 mg by mouth daily.   aspirin 81 MG tablet Take 81 mg by mouth every evening.   diphenhydramine-acetaminophen 25-500 MG Tabs tablet Commonly known as:  TYLENOL PM Take 1 tablet by mouth at bedtime as needed.   Fish Oil 1000 MG Caps Take 1 capsule by mouth 2 (two) times daily.   furosemide 40 MG tablet Commonly known as:  LASIX Take 1 tablet (40 mg total) by mouth 2 (two) times daily.   KLOR-CON 10 10 MEQ  tablet Generic drug:  potassium chloride Take 10 mEq by mouth 2 (two) times daily.   levofloxacin 500 MG tablet Commonly known as:  LEVAQUIN Take 1 tablet (500 mg total) by mouth daily.   losartan 100 MG tablet Commonly known as:  COZAAR Take 1 tablet (100 mg total) by mouth daily.   metFORMIN 500 MG tablet Commonly known as:  GLUCOPHAGE Take 500 mg by mouth 3 (three) times daily.   metoprolol succinate 50 MG 24 hr tablet Commonly known as:  TOPROL-XL Take 1 tablet by mouth daily.   predniSONE 20 MG tablet Commonly known as:  DELTASONE Take 1 tabs po daily x 5 days Start taking on:  06/01/2016   simvastatin 40 MG tablet Commonly known as:  ZOCOR Take 40 mg by mouth every evening.   tamsulosin 0.4 MG Caps capsule Commonly known as:  FLOMAX Take 0.4 mg by mouth every morning.   vitamin B-12 500 MCG tablet Commonly known as:  CYANOCOBALAMIN Take 500 mcg by mouth daily.       No Known Allergies  Procedures/Studies: Ct Chest Wo Contrast  Result Date: 05/30/2016 CLINICAL DATA:  Right pleural effusion. EXAM: CT CHEST WITHOUT CONTRAST TECHNIQUE: Multidetector CT imaging of the chest was performed following the standard protocol without IV contrast. COMPARISON:  Chest radiograph of May 29, 2016. CT scan of December 29, 2008. FINDINGS: Cardiovascular: Atherosclerosis of thoracic aorta is noted without aneurysm formation. Coronary artery calcifications are noted. Mild pericardial effusion is noted. Mediastinum/Nodes: No enlarged mediastinal or axillary lymph nodes. Thyroid gland, trachea, and esophagus demonstrate no significant findings. Lungs/Pleura: No pneumothorax is noted. Left lung is clear. Moderate right pleural effusion is noted with adjacent atelectasis or pneumonia of the right lower lobe. Probable right middle lobe pneumonia is noted as well. Upper Abdomen: Cholelithiasis. No other abnormality seen in the visualized portion of upper abdomen. Musculoskeletal: No chest  wall mass or suspicious bone lesions identified. IMPRESSION: Aortic atherosclerosis. Coronary artery calcifications are noted suggesting coronary artery disease. Mild pericardial effusion. Moderate right pleural effusion is noted with adjacent atelectasis or pneumonia the right lower lobe. Right middle lobe pneumonia. Cholelithiasis. Electronically Signed   By: Lupita Raider, M.D.   On: 05/30/2016 15:24   US Renal  Result Date: 05/29/2016 CLINICAL DATA:  Urinary tract infection. History of diabetes, coronary artery disease. EXAM: RENAL / URINARY TRACT ULTRASOUND COMPLETE COMPARISON:  None in PACs FINDINGS: Right Kidney: Length: 11.7 cm. The renal cortical echotexture is lower than that of the adjacent liver. There is mild diffuse cortical thinning. There is no hydronephrosis. Left Kidney: Length: 12.7 cm. The renal cortical echotexture is similar to that on the right. There is mild cortical thinning. Bladder: The partially distended urinary bladder is unremarkable. Ureteral jets were not observed. Incidental note is made of gallstones. No positive sonographic Eulah Pont sign was reported. IMPRESSION: Mild renal cortical atrophy bilaterally. Preserved renal cortical echotexture. No hydronephrosis. Gallstones. Electronically Signed   By: Zoey  [place]  M.D.   On: 05/29/2016 13:31   Dg Chest Port 1 View  Result Date: 05/30/2016 CLINICAL DATA:  Increased sob, wheezing. Pt came to ER yesterday for productive cough and fever since Tues. History of DM, HTN, CAD, COPD, RBBB, cardiomyopathy. EXAM: PORTABLE CHEST 1 VIEW COMPARISON:  05/29/2016 FINDINGS: Heart size is mildly enlarged. There is persistent right-sided pleural effusion and right basilar opacity, unchanged over prior studies. Minimal left lower lobe atelectasis and probable trace left pleural effusion. No pulmonary edema. IMPRESSION: Stable appearance of right lower lobe opacity and pleural effusion. Left lower lobe atelectasis and small effusion.  Electronically Signed   By: Norva Pavlov M.D.   On: 05/30/2016 19:11   Dg Chest Port 1 View  Result Date: 05/29/2016 CLINICAL DATA:  Cough, fever and shortness of breath. EXAM: PORTABLE CHEST 1 VIEW COMPARISON:  Frontal and lateral views 03/14/2016 FINDINGS: Moderate-large right pleural effusion occupying the lower 1/2 of right hemithorax, likely increased from prior exam. Associated right basilar opacity. Decreased blunting of left costophrenic angle. Unchanged heart size and mediastinal contours, no mediastinal shift. Aortic arch atherosclerosis. No pneumothorax. Stable pulmonary vasculature. Bronchial thickening again seen. IMPRESSION: 1. Moderate-to-large right pleural effusion, increased from prior exam. Likely associated atelectasis. 2. Improved left pleural effusion. 3. Bronchial thickening is unchanged. 4. Aortic arch atherosclerosis. Electronically Signed   By: Rubye Oaks M.D.   On: 05/29/2016 04:21    (Echo, Carotid, EGD, Colonoscopy, ERCP)   Discharge Exam: see separate progress note dated 05/31/16 Vitals:   05/31/16 0737 05/31/16 0808  BP:  (!) 179/66  Pulse: (!) 56 (!) 113  Resp: 20 (!) 22  Temp:  98.2 F (36.8 C)   Vitals:   05/31/16 0737 05/31/16 0808 05/31/16 0923 05/31/16 1136  BP:  (!) 179/66    Pulse: (!) 56 (!) 113    Resp: 20 (!) 22    Temp:  98.2 F (36.8 C)    TempSrc:  Oral    SpO2: 100% 100% 99% 95%  Weight:      Height:        The results of significant diagnostics from this hospitalization (including imaging, microbiology, ancillary and laboratory) are listed below for reference.     Microbiology: Recent Results (from the past 240 hour(s))  Blood Culture (routine x 2)     Status: None (Preliminary result)   Collection Time: 05/29/16  3:33 AM  Result Value Ref Range Status   Specimen Description BLOOD RIGHT ANTECUBITAL  Final   Special Requests   Final    BOTTLES DRAWN AEROBIC AND ANAEROBIC AEB 6CC ANA 8CC   Culture NO GROWTH 2 DAYS  Final    Report Status PENDING  Incomplete  Urine culture     Status: Abnormal   Collection Time: 05/29/16  3:49 AM  Result Value Ref Range Status   Specimen Description URINE, RANDOM  Final   Special Requests NONE  Final   Culture MULTIPLE SPECIES PRESENT, SUGGEST RECOLLECTION (A)  Final   Report Status 05/30/2016 FINAL  Final  Blood Culture (routine x 2)     Status: None (Preliminary result)   Collection Time: 05/29/16  4:05 AM  Result Value Ref Range Status   Specimen Description BLOOD RIGHT FOREARM  Final   Special Requests BOTTLES DRAWN AEROBIC AND ANAEROBIC 10CC EACH  Final   Culture NO GROWTH 2 DAYS  Final   Report Status PENDING  Incomplete    Labs: BNP (last 3 results)  Recent Labs  05/29/16 0333  BNP 166.0*   Basic Metabolic Panel:  Recent Labs Lab 05/29/16 0333 05/30/16 1031  NA 137 136  K 3.9 3.7  CL 102 103  CO2 23 22  GLUCOSE 167* 333*  BUN 26* 28*  CREATININE 1.52* 1.25*  CALCIUM 8.5* 8.4*   Liver Function Tests:  Recent Labs Lab 05/29/16 0333 05/30/16 1031  AST 37 58*  ALT 22 26  ALKPHOS 53 42  BILITOT 1.7* 0.7  PROT 7.6 6.5  ALBUMIN 4.3 3.6   No results for input(s): LIPASE, AMYLASE in the last 168 hours. No results for input(s): AMMONIA in the last 168 hours. CBC:  Recent Labs Lab 05/29/16 0333 05/30/16 1031 05/30/16 1632  WBC 6.8 5.5 7.1  NEUTROABS 5.8  --  6.4  HGB 13.3 11.8* 11.6*  HCT 39.9 34.6* 34.6*  MCV 90.1 89.4 89.9  PLT PLATELET CLUMPS NOTED ON SMEAR, COUNT APPEARS DECREASED 86* 84*   Cardiac Enzymes: No results for input(s): CKTOTAL, CKMB, CKMBINDEX, TROPONINI in the last 168 hours. BNP: Invalid input(s): POCBNP CBG:  Recent Labs Lab 05/30/16 1625 05/30/16 2139 05/31/16 0304 05/31/16 0720 05/31/16 1118  GLUCAP 176* 210* 157* 178* 173*   D-Dimer No results for input(s): DDIMER in the last 72 hours. Hgb A1c No results for input(s): HGBA1C in the last 72 hours. Lipid Profile No results for input(s): CHOL, HDL,  LDLCALC, TRIG, CHOLHDL, LDLDIRECT in the last 72 hours. Thyroid function studies No results for input(s): TSH, T4TOTAL, T3FREE, THYROIDAB in the last 72 hours.  Invalid input(s): FREET3 Anemia work up No results for input(s): VITAMINB12, FOLATE, FERRITIN, TIBC, IRON, RETICCTPCT in the last 72 hours. Urinalysis    Component Value Date/Time   COLORURINE YELLOW 05/29/2016 0349   APPEARANCEUR CLOUDY (A) 05/29/2016 0349   LABSPEC 1.014 05/29/2016 0349   PHURINE 5.0 05/29/2016 0349   GLUCOSEU NEGATIVE 05/29/2016 0349   HGBUR MODERATE (A) 05/29/2016 0349   BILIRUBINUR NEGATIVE 05/29/2016 0349   KETONESUR 5 (A) 05/29/2016 0349   PROTEINUR 30 (A) 05/29/2016 0349   NITRITE NEGATIVE 05/29/2016 0349   LEUKOCYTESUR LARGE (A) 05/29/2016 0349   Sepsis Labs Invalid input(s): PROCALCITONIN,  WBC,  LACTICIDVEN Microbiology Recent Results (from the past 240 hour(s))  Blood Culture (routine x 2)     Status: None (Preliminary result)   Collection Time: 05/29/16  3:33 AM  Result Value Ref Range Status   Specimen Description BLOOD RIGHT ANTECUBITAL  Final   Special Requests   Final    BOTTLES DRAWN AEROBIC AND ANAEROBIC AEB 6CC ANA 8CC   Culture NO GROWTH 2 DAYS  Final   Report Status PENDING  Incomplete  Urine culture     Status: Abnormal   Collection Time: 05/29/16  3:49 AM  Result Value Ref Range Status   Specimen Description URINE, RANDOM  Final   Special Requests NONE  Final   Culture MULTIPLE SPECIES PRESENT, SUGGEST RECOLLECTION (A)  Final   Report Status 05/30/2016 FINAL  Final  Blood Culture (routine x 2)     Status: None (Preliminary result)   Collection Time: 05/29/16  4:05 AM  Result Value Ref Range Status   Specimen Description BLOOD RIGHT FOREARM  Final   Special Requests BOTTLES DRAWN AEROBIC AND ANAEROBIC 10CC EACH  Final   Culture NO GROWTH 2 DAYS  Final   Report Status PENDING  Incomplete   Time coordinating discharge: 32 minutes  SIGNED:  Standley Dakins, MD  [facility] 05/31/2016, 1:19 PM Pager   If 7PM-7AM, please contact  night-coverage [email or web address] Password [email or web address]

## 2016-05-31 NOTE — Progress Notes (Signed)
RN reported that patient is agitated, requesting sedatives. Patient was seen, clearly mouth breathing.  Not in distress.  Sat is adequate, but instead of getting ABG, will give a trial of 100 %NR, and if there is a continuation of agitation, will obtain ABG prior to giving patient sedatives.  Houston Siren MD FACP. Hospitalist.

## 2016-05-31 NOTE — Progress Notes (Signed)
PROGRESS NOTE    Ryan [name]  ZOX:[id number] DOB: 1932/12/03 DOA: 05/29/2016 PCP: Kirstie Peri, MD   Brief Narrative:  83 yo male with COPD, presents with worsening dyspnea. On the initial physical examination, patient with tachypnea at 34 breaths per minute. Significant abnormal lung examination with rhonchi and rales, chest film with bilateral pleural effusions, influenza A positive and urine analysis positive for urine infection. Patient started on bronchodilator therapy, antibiotics and admitted for further evaluation.   Assessment & Plan:   Principal Problem:   COPD (chronic obstructive pulmonary disease) (HCC) Active Problems:   CAD (coronary artery disease)   Hypertension   DM (diabetes mellitus) (HCC)   Dyslipidemia   Pleural effusion   COPD exacerbation (HCC)   1. COPD exacerbation. Clinically improving, weaned down to room air.  Will continue bronchodilator therapy with duonebs and reducing steroids. Continue oxymetry monitoring. Antibiotic therapy with levofloxacin. Continue guaifenesin. Clinically starting to improve.    2. Bilateral pleural effusion. Chest film personally reviewed, noted bilateral pleural effusions, on the right has been chronic when compared with old films, suspected right hemidiaphragm elevation,  non contrasted chest CT shows RLL pneumonia.    3. Acute influenza A. Will continue oseltamivir, droplet precautions.   4. Urine tract infection. Multiple species seen on culture, no significant findings.   5. T2DM. Blood sugars much improved, following. Weaning steroids as above.    6. CAD. No chest pain continue asa and statin therapy.   7. HTN. Will continue amlodipine, furosemide and metoprolol.  8. Elevated lactic acid - bolused IVF 2/2 and repeat lactate within normal limits.    9.  Sundowning/acute delirium - continue to try and orient patient, asked for family to come in and sit with patient to help keep him oriented, sitter if needed. Try  seroquel QHS prn    DVT prophylaxis: heparin  Code Status: full  Family Communication: family  Disposition Plan:  TBD family saying they cannot handle at home, will likely need placement  Procedures:    Antimicrobials:  Levofloxacin.   Subjective: Pt had sundowning and confusion overnight and still slightly confused and agitated this morning.     Objective: Vitals:   05/31/16 0712 05/31/16 0737 05/31/16 0808 05/31/16 0923  BP:   (!) 179/66   Pulse:  (!) 56 (!) 113   Resp:  20 (!) 22   Temp:   98.2 F (36.8 C)   TempSrc:   Oral   SpO2: 95% 100% 100% 99%  Weight:      Height:        Intake/Output Summary (Last 24 hours) at 05/31/16 1122 Last data filed at 05/31/16 1112  Gross per 24 hour  Intake              360 ml  Output              700 ml  Net             -340 ml   Filed Weights   05/29/16 0333 05/29/16 1227  Weight: 86.6 kg (191 lb) 88.9 kg (196 lb)    Examination:  General exam: deconditioned and ill looking appearing E ENT: no pallor or icterus, oral mucosa moist.  Respiratory system: diminshed breath sounds bilaterally, no increased WOB Cardiovascular system: S1 & S2 heard. No JVD, murmurs, rubs, gallops or clicks. Trace pedal edema. Gastrointestinal system: Abdomen is nondistended, soft and nontender. No organomegaly or masses felt. Normal bowel sounds heard. Central nervous system: Alert and oriented.  No focal neurological deficits. Extremities: Symmetric 5 x 5 power. Skin: No rashes, lesions or ulcers  Data Reviewed: I have personally reviewed following labs and imaging studies  CBC:  Recent Labs Lab 05/29/16 0333 05/30/16 1031 05/30/16 1632  WBC 6.8 5.5 7.1  NEUTROABS 5.8  --  6.4  HGB 13.3 11.8* 11.6*  HCT 39.9 34.6* 34.6*  MCV 90.1 89.4 89.9  PLT PLATELET CLUMPS NOTED ON SMEAR, COUNT APPEARS DECREASED 86* 84*   Basic Metabolic Panel:  Recent Labs Lab 05/29/16 0333 05/30/16 1031  NA 137 136  K 3.9 3.7  CL 102 103  CO2 23 22    GLUCOSE 167* 333*  BUN 26* 28*  CREATININE 1.52* 1.25*  CALCIUM 8.5* 8.4*   GFR: Estimated Creatinine Clearance: 50.9 mL/min (by C-G formula based on SCr of 1.25 mg/dL (H)). Liver Function Tests:  Recent Labs Lab 05/29/16 0333 05/30/16 1031  AST 37 58*  ALT 22 26  ALKPHOS 53 42  BILITOT 1.7* 0.7  PROT 7.6 6.5  ALBUMIN 4.3 3.6   No results for input(s): LIPASE, AMYLASE in the last 168 hours. No results for input(s): AMMONIA in the last 168 hours. Coagulation Profile: No results for input(s): INR, PROTIME in the last 168 hours. Cardiac Enzymes: No results for input(s): CKTOTAL, CKMB, CKMBINDEX, TROPONINI in the last 168 hours. BNP (last 3 results) No results for input(s): PROBNP in the last 8760 hours. HbA1C: No results for input(s): HGBA1C in the last 72 hours. CBG:  Recent Labs Lab 05/30/16 1625 05/30/16 2139 05/31/16 0304 05/31/16 0720 05/31/16 1118  GLUCAP 176* 210* 157* 178* 173*   Lipid Profile: No results for input(s): CHOL, HDL, LDLCALC, TRIG, CHOLHDL, LDLDIRECT in the last 72 hours. Thyroid Function Tests: No results for input(s): TSH, T4TOTAL, FREET4, T3FREE, THYROIDAB in the last 72 hours. Anemia Panel: No results for input(s): VITAMINB12, FOLATE, FERRITIN, TIBC, IRON, RETICCTPCT in the last 72 hours. Sepsis Labs:  Recent Labs Lab 05/30/16 1505 05/30/16 1644 05/30/16 1953 05/31/16 0702  LATICACIDVEN 4.3* 4.0* 4.1* 1.8    Recent Results (from the past 240 hour(s))  Blood Culture (routine x 2)     Status: None (Preliminary result)   Collection Time: 05/29/16  3:33 AM  Result Value Ref Range Status   Specimen Description BLOOD RIGHT ANTECUBITAL  Final   Special Requests   Final    BOTTLES DRAWN AEROBIC AND ANAEROBIC AEB 6CC ANA 8CC   Culture NO GROWTH 1 DAY  Final   Report Status PENDING  Incomplete  Urine culture     Status: Abnormal   Collection Time: 05/29/16  3:49 AM  Result Value Ref Range Status   Specimen Description URINE, RANDOM   Final   Special Requests NONE  Final   Culture MULTIPLE SPECIES PRESENT, SUGGEST RECOLLECTION (A)  Final   Report Status 05/30/2016 FINAL  Final  Blood Culture (routine x 2)     Status: None (Preliminary result)   Collection Time: 05/29/16  4:05 AM  Result Value Ref Range Status   Specimen Description BLOOD RIGHT FOREARM  Final   Special Requests BOTTLES DRAWN AEROBIC AND ANAEROBIC 10CC EACH  Final   Culture NO GROWTH 1 DAY  Final   Report Status PENDING  Incomplete    Radiology Studies: Ct Chest Wo Contrast  Result Date: 05/30/2016 CLINICAL DATA:  Right pleural effusion. EXAM: CT CHEST WITHOUT CONTRAST TECHNIQUE: Multidetector CT imaging of the chest was performed following the standard protocol without IV contrast. COMPARISON:  Chest radiograph  of May 29, 2016. CT scan of December 29, 2008. FINDINGS: Cardiovascular: Atherosclerosis of thoracic aorta is noted without aneurysm formation. Coronary artery calcifications are noted. Mild pericardial effusion is noted. Mediastinum/Nodes: No enlarged mediastinal or axillary lymph nodes. Thyroid gland, trachea, and esophagus demonstrate no significant findings. Lungs/Pleura: No pneumothorax is noted. Left lung is clear. Moderate right pleural effusion is noted with adjacent atelectasis or pneumonia of the right lower lobe. Probable right middle lobe pneumonia is noted as well. Upper Abdomen: Cholelithiasis. No other abnormality seen in the visualized portion of upper abdomen. Musculoskeletal: No chest wall mass or suspicious bone lesions identified. IMPRESSION: Aortic atherosclerosis. Coronary artery calcifications are noted suggesting coronary artery disease. Mild pericardial effusion. Moderate right pleural effusion is noted with adjacent atelectasis or pneumonia the right lower lobe. Right middle lobe pneumonia. Cholelithiasis. Electronically Signed   By: Lupita Raider, M.D.   On: 05/30/2016 15:24   US Renal  Result Date: 05/29/2016 CLINICAL DATA:   Urinary tract infection. History of diabetes, coronary artery disease. EXAM: RENAL / URINARY TRACT ULTRASOUND COMPLETE COMPARISON:  None in PACs FINDINGS: Right Kidney: Length: 11.7 cm. The renal cortical echotexture is lower than that of the adjacent liver. There is mild diffuse cortical thinning. There is no hydronephrosis. Left Kidney: Length: 12.7 cm. The renal cortical echotexture is similar to that on the right. There is mild cortical thinning. Bladder: The partially distended urinary bladder is unremarkable. Ureteral jets were not observed. Incidental note is made of gallstones. No positive sonographic Eulah Pont sign was reported. IMPRESSION: Mild renal cortical atrophy bilaterally. Preserved renal cortical echotexture. No hydronephrosis. Gallstones. Electronically Signed   By: Salathiel  [place] M.D.   On: 05/29/2016 13:31   Dg Chest Port 1 View  Result Date: 05/30/2016 CLINICAL DATA:  Increased sob, wheezing. Pt came to ER yesterday for productive cough and fever since Tues. History of DM, HTN, CAD, COPD, RBBB, cardiomyopathy. EXAM: PORTABLE CHEST 1 VIEW COMPARISON:  05/29/2016 FINDINGS: Heart size is mildly enlarged. There is persistent right-sided pleural effusion and right basilar opacity, unchanged over prior studies. Minimal left lower lobe atelectasis and probable trace left pleural effusion. No pulmonary edema. IMPRESSION: Stable appearance of right lower lobe opacity and pleural effusion. Left lower lobe atelectasis and small effusion. Electronically Signed   By: Norva Pavlov M.D.   On: 05/30/2016 19:11   Scheduled Meds: . acetaminophen  650 mg Oral Once  . amLODipine  10 mg Oral Daily  . aspirin  81 mg Oral QPM  . atorvastatin  20 mg Oral q1800  . furosemide  40 mg Oral BID  . heparin  5,000 Units Subcutaneous Q8H  . insulin aspart  0-20 Units Subcutaneous TID WC  . insulin aspart  0-5 Units Subcutaneous QHS  . insulin aspart  10 Units Subcutaneous TID WC  . insulin glargine  20 Units  Subcutaneous QHS  . ipratropium-albuterol  3 mL Nebulization TID  . levofloxacin (LEVAQUIN) IV  750 mg Intravenous Q48H  . [START ON 06/01/2016] methylPREDNISolone (SOLU-MEDROL) injection  40 mg Intravenous Q24H  . metoprolol succinate  50 mg Oral Daily  . omega-3 acid ethyl esters  1 g Oral BID  . oseltamivir  30 mg Oral BID  . tamsulosin  0.4 mg Oral q morning - 10a   Continuous Infusions:   LOS: 2 days   Standley Dakins, MD [facility] Pager [phone]  If 7PM-7AM, please contact night-coverage [email or web address] Password [email or web address] 05/31/2016, 11:22 AM
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
## 2016-05-31 NOTE — Progress Notes (Signed)
**Note De-Identified  Obfuscation** Patient placed back onto 2 L [place] per ABG; tolerating well.  RRT to continue to monitor.

## 2016-05-31 NOTE — Progress Notes (Signed)
05/31/2016 1:08 PM  I met with patient's wife at bedside.  She says that she has dealt with the patient and his acute delirium with other hospitalizations and she says that he has some episodes at home as well.  She said that she would rather take him home and get him in a stable environment.  I offered to have him stay and get him placed but she politely declined saying that the patient would never go for that and that he would end up much worse off. She says that she feels comfortable taking him home and taking care of him. She also has her daughters for support.  She decided to take him in her truck and says that she did not want ambulance transfer at this time.  She requested that his prescriptions [facility].   Murvin Natal, MD

## 2016-06-03 LAB — CULTURE, BLOOD (ROUTINE X 2)
Culture: NO GROWTH
Culture: NO GROWTH

## 2016-08-14 NOTE — Progress Notes (Signed)
Cardiology Office Note  Date: 08/15/2016   ID: Ryan [name], DOB Sep 30, 1932, MRN [id number]  PCP: Kirstie Peri, MD  Primary Cardiologist: Nona Dell, MD   Chief Complaint  Patient presents with  . Shortness of Breath  . Leg Swelling    History of Present Illness: GAD EMGE is an 83 y.o. male last seen in November 2017. He is referred back to the office by Dr. Sherryll Burger. He is here today with his wife and 2 daughters. I did not receive any records from Dr. Sherryll Burger, but apparently the patient has had progressive problems with shortness of breath and leg swelling since hospitalization back in February. He has had Lasix intensified within the last few weeks, perhaps a loss of about 3 pounds overall, but no major change in his symptoms. He has undergone no other testing during this time.  Records indicate hospitalization in February of this year with respiratory failure associated with COPD exacerbation, bilateral pleural effusions, and influenza A. He was managed on the hospitalist team. I note that ECG done at that time showed rate-controlled atrial fibrillation. He did not have a repeat echocardiogram.  I reviewed his home vital sign checks and weights. His heart rate has been controlled. I personally reviewed his ECG today which shows probable course atrial fibrillation with heart rate in the 70s and incomplete right bundle branch block pattern, also low voltage.  We went over his medications which are outlined below. Most recently he has been taking Lasix 40 mg 2 tablets in the morning and 1 in the evening as of visit with Dr. Sherryll Burger a few days ago.  He does have a history of cardiomyopathy with normalization of LVEF by echocardiogram back in May of last year. History of nonobstructive CAD based on workup in 2005, he has declined follow-up ischemic testing.  Past Medical History:  Diagnosis Date  . Allergic rhinitis   . CAD (coronary artery disease)    Mild nonobstructive disease 2005    . Calculus of kidney   . Colon polyp   . COPD (chronic obstructive pulmonary disease) (HCC)   . Dyslipidemia   . Essential hypertension   . Fatty liver   . History of cardiomyopathy    Normalization of LVEF on medical therapy  . History of prostatitis   . Osteoarthritis   . Right bundle branch block   . Type 2 diabetes mellitus (HCC)     Past Surgical History:  Procedure Laterality Date  . COLONOSCOPY    . HEMICOLECTOMY      Current Outpatient Prescriptions  Medication Sig Dispense Refill  . amLODipine (NORVASC) 10 MG tablet Take 10 mg by mouth daily.    Marland Kitchen aspirin 81 MG tablet Take 81 mg by mouth every evening.    . diphenhydramine-acetaminophen (TYLENOL PM) 25-500 MG TABS tablet Take 1 tablet by mouth at bedtime as needed.    Marland Kitchen levofloxacin (LEVAQUIN) 500 MG tablet Take 1 tablet (500 mg total) by mouth daily. 7 tablet 0  . losartan (COZAAR) 100 MG tablet Take 1 tablet (100 mg total) by mouth daily. 30 tablet 3  . metFORMIN (GLUCOPHAGE) 500 MG tablet Take 500 mg by mouth 3 (three) times daily.     . metoprolol succinate (TOPROL-XL) 50 MG 24 hr tablet Take 1 tablet by mouth daily.    . Omega-3 Fatty Acids (FISH OIL) 1000 MG CAPS Take 1 capsule by mouth 2 (two) times daily.     . potassium chloride (KLOR-CON 10) 10 MEQ  tablet Take 10 mEq by mouth 2 (two) times daily.    . predniSONE (DELTASONE) 20 MG tablet Take 1 tabs po daily x 5 days 5 tablet 0  . simvastatin (ZOCOR) 40 MG tablet Take 40 mg by mouth every evening.    . Tamsulosin HCl (FLOMAX) 0.4 MG CAPS Take 0.4 mg by mouth every morning.     . vitamin B-12 (CYANOCOBALAMIN) 500 MCG tablet Take 500 mcg by mouth daily.    . furosemide (LASIX) 40 MG tablet Take 2 tablets (80 mg total) by mouth 2 (two) times daily. 360 tablet 3   No current facility-administered medications for this visit.    Allergies:  Patient has no known allergies.   Social History: The patient  reports that he quit smoking about 31 years ago. His smoking  use included Cigarettes. He started smoking about 63 years ago. He has a 80.00 pack-year smoking history. He has never used smokeless tobacco. He reports that he does not drink alcohol.   Family History: The patient's family history is not on file.   ROS:  Please see the history of present illness. Otherwise, complete review of systems is positive for fatigue and shortness of breath.  All other systems are reviewed and negative.   Physical Exam: VS:  BP 120/62   Pulse 60   Ht 6' (1.829 m)   Wt 200 lb (90.7 kg)   SpO2 94%   BMI 27.12 kg/m , BMI Body mass index is 27.12 kg/m.  Wt Readings from Last 3 Encounters:  08/15/16 200 lb (90.7 kg)  05/29/16 196 lb (88.9 kg)  03/05/16 207 lb (93.9 kg)    General: Obese elderly male, no distress. HEENT: Conjunctiva and lids normal, oropharynx clear. Neck: Supple, no elevated JVP or carotid bruits, no thyromegaly. Lungs: Decreased breath sounds right hemithorax suggestive of large effusion, also decreased left base, nonlabored breathing at rest. Cardiac: Largely regular rhythm, no S3, soft systolic murmur, no pericardial rub. Abdomen: Soft, protuberant, bowel sounds present. Extremities: 2-3+ bilateral leg, distal pulses 1-2+. Skin: Warm and dry. Musculoskeletal: No kyphosis. Neuropsychiatric: Alert and oriented x3, affect grossly appropriate.  ECG: I personally reviewed the tracing from 05/29/2016 which showed atrial fibrillation, rate controlled with right bundle branch block.  Recent Labwork: 05/29/2016: B Natriuretic Peptide 166.0 05/30/2016: ALT 26; AST 58; BUN 28; Creatinine, Ser 1.25; Hemoglobin 11.6; Platelets 84; Potassium 3.7; Sodium 136   Other Studies Reviewed Today:  Echocardiogram 08/30/2015: Study Conclusions  - Left ventricle: The cavity size was normal. Wall thickness was   normal. Systolic function was normal. The estimated ejection   fraction was in the range of 60% to 65%. Diastolic function is   abnormal, indeterminate  grade. There is evidence of elevated LA   pressure. Wall motion was normal; there were no regional wall   motion abnormalities. - Aortic valve: Mildly calcified annulus. Mildly thickened   leaflets. There was trivial regurgitation. Valve area (VTI): 2.13   cm^2. Valve area (Vmax): 1.98 cm^2. Valve area (Vmean): 1.87   cm^2. - Mitral valve: There was mild regurgitation. - Pulmonary veins: There is systolic blunting of pulmonary vein   flow consistent with elevated LA pressure. - Atrial septum: Possibly aneurysmal but difficult to visualized.   No ASD or PFO. - Inferior vena cava: The vessel was dilated. The respirophasic   diameter changes were blunted (< 50%), consistent with elevated   central venous pressure. - Pericardium, extracardiac: There is a small circumferential   pericardial effusion measuring  0.8 cm adjacent to the LV in   diastole. - Technically adequate study.  Assessment and Plan:  1. Patient presents with progressive shortness of breath and leg edema since hospitalization in February as outlined above. He has evidence of significant volume overload and potentially a large right pleural effusion based on examination. Also looks to be in atrial fibrillation although heart rate controlled. Have discussed these issues with the patient and family members present. Plan is to obtain a PA and lateral chest x-ray, BMET, increase Lasix to 80 mg twice daily, and arrange an echocardiogram to reassess LVEF. Concern is that he has developed a recurrent cardiomyopathy and has systolic heart failure. He is not enthusiastic about hospitalization, but will consider it if necessary. We will try with outpatient workup first, close follow-up arranged next week in the office.  2. History of nonischemic cardiomyopathy with normalization of LVEF based on prior workup. Asked echocardiogram was in May of last year as detailed above.  3. Essential hypertension. Systolic blood pressure in the 120s  today. He is currently on Norvasc, Cozaar, and Toprol-XL.  4. History of nonobstructive CAD based on workup back in 2005. He declines follow-up ischemic testing.  5. Persistent atrial fibrillation, onset uncertain but present during hospitalization back in February. CHADSVASC score is 5. We did discuss the possibility of anticoagulation for stroke prophylaxis, although will hold off at least initially pending further stabilization of his volume status, may need to have a thoracentesis.  Current medicines were reviewed with the patient today.   Orders Placed This Encounter  Procedures  . DG Chest 2 View  . Basic Metabolic Panel (BMET)  . EKG 12-Lead  . ECHOCARDIOGRAM COMPLETE    Disposition: Follow-up next week.  Signed, Jonelle Sidle, MD, [facility] 08/15/2016 11:24 AM    [facility] at [facility] [address] [place], [place] [address] Phone: [phone]; Fax: [phone]

## 2016-08-15 ENCOUNTER — Other Ambulatory Visit (HOSPITAL_COMMUNITY)
Admission: RE | Admit: 2016-08-15 | Discharge: 2016-08-15 | Disposition: A | Discharge status: Home / Self Care | Payer: Medicare Other | Source: Ambulatory Visit | Attending: Cardiology | Admitting: Cardiology

## 2016-08-15 ENCOUNTER — Ambulatory Visit (INDEPENDENT_AMBULATORY_CARE_PROVIDER_SITE_OTHER): Payer: Medicare Other | Admitting: Cardiology

## 2016-08-15 ENCOUNTER — Ambulatory Visit (HOSPITAL_COMMUNITY)
Admission: RE | Admit: 2016-08-15 | Discharge: 2016-08-15 | Disposition: A | Discharge status: Home / Self Care | Payer: Medicare Other | Source: Ambulatory Visit | Attending: Cardiology | Admitting: Cardiology

## 2016-08-15 ENCOUNTER — Encounter: Payer: Self-pay | Admitting: Cardiology

## 2016-08-15 VITALS — BP 120/62 | HR 60 | Ht 72.0 in | Wt 200.0 lb

## 2016-08-15 DIAGNOSIS — R0602 Shortness of breath: Secondary | ICD-10-CM

## 2016-08-15 DIAGNOSIS — J90 Pleural effusion, not elsewhere classified: Secondary | ICD-10-CM | POA: Diagnosis not present

## 2016-08-15 DIAGNOSIS — I2510 Atherosclerotic heart disease of native coronary artery without angina pectoris: Secondary | ICD-10-CM | POA: Diagnosis not present

## 2016-08-15 DIAGNOSIS — Z8679 Personal history of other diseases of the circulatory system: Secondary | ICD-10-CM | POA: Diagnosis not present

## 2016-08-15 DIAGNOSIS — I481 Persistent atrial fibrillation: Secondary | ICD-10-CM

## 2016-08-15 DIAGNOSIS — I4819 Other persistent atrial fibrillation: Secondary | ICD-10-CM

## 2016-08-15 DIAGNOSIS — I10 Essential (primary) hypertension: Secondary | ICD-10-CM

## 2016-08-15 LAB — BASIC METABOLIC PANEL
Anion gap: 12 (ref 5–15)
BUN: 25 mg/dL — ABNORMAL HIGH (ref 6–20)
CO2: 27 mmol/L (ref 22–32)
Calcium: 9.7 mg/dL (ref 8.9–10.3)
Chloride: 101 mmol/L (ref 101–111)
Creatinine, Ser: 1.46 mg/dL — ABNORMAL HIGH (ref 0.61–1.24)
GFR calc non Af Amer: 43 mL/min — ABNORMAL LOW (ref >60)
GFR, EST AFRICAN AMERICAN: 50 mL/min — AB (ref >60)
Glucose, Bld: 105 mg/dL — ABNORMAL HIGH (ref 65–99)
POTASSIUM: 3.6 mmol/L (ref 3.5–5.1)
SODIUM: 140 mmol/L (ref 135–145)

## 2016-08-15 MED ORDER — FUROSEMIDE 40 MG PO TABS: 80.0000 mg | ORAL_TABLET | Freq: Two times a day (BID) | ORAL | 3 refills | Status: DC

## 2016-08-15 NOTE — Patient Instructions (Addendum)
Your physician recommends that you schedule a follow-up appointment in:  1 week  Your physician has requested that you have an echocardiogram. Echocardiography is a painless test that uses sound waves to create images of your heart. It provides your doctor with information about the size and shape of your heart and how well your heart's chambers and valves are working. This procedure takes approximately one hour. There are no restrictions for this procedure.    INCREASE Lasix to 80 mg twice a day    Get blood work NOW :Bmet  Get chest x-ray NOW       Thank you for choosing [facility] !

## 2016-08-18 ENCOUNTER — Ambulatory Visit (HOSPITAL_COMMUNITY)
Admission: RE | Admit: 2016-08-18 | Discharge: 2016-08-18 | Disposition: A | Discharge status: Home / Self Care | Payer: Medicare Other | Source: Ambulatory Visit | Attending: Cardiology | Admitting: Cardiology

## 2016-08-18 DIAGNOSIS — J90 Pleural effusion, not elsewhere classified: Secondary | ICD-10-CM | POA: Diagnosis present

## 2016-08-18 DIAGNOSIS — I313 Pericardial effusion (noninflammatory): Secondary | ICD-10-CM | POA: Insufficient documentation

## 2016-08-18 DIAGNOSIS — R0602 Shortness of breath: Secondary | ICD-10-CM | POA: Insufficient documentation

## 2016-08-18 DIAGNOSIS — I509 Heart failure, unspecified: Secondary | ICD-10-CM | POA: Insufficient documentation

## 2016-08-18 DIAGNOSIS — J449 Chronic obstructive pulmonary disease, unspecified: Secondary | ICD-10-CM | POA: Diagnosis not present

## 2016-08-18 DIAGNOSIS — I110 Hypertensive heart disease with heart failure: Secondary | ICD-10-CM | POA: Diagnosis not present

## 2016-08-18 DIAGNOSIS — I2510 Atherosclerotic heart disease of native coronary artery without angina pectoris: Secondary | ICD-10-CM | POA: Diagnosis not present

## 2016-08-18 DIAGNOSIS — E119 Type 2 diabetes mellitus without complications: Secondary | ICD-10-CM | POA: Diagnosis not present

## 2016-08-18 DIAGNOSIS — I081 Rheumatic disorders of both mitral and tricuspid valves: Secondary | ICD-10-CM | POA: Insufficient documentation

## 2016-08-18 DIAGNOSIS — Z8679 Personal history of other diseases of the circulatory system: Secondary | ICD-10-CM | POA: Diagnosis not present

## 2016-08-18 DIAGNOSIS — I4510 Unspecified right bundle-branch block: Secondary | ICD-10-CM | POA: Insufficient documentation

## 2016-08-18 NOTE — Progress Notes (Signed)
*  PRELIMINARY RESULTS* Echocardiogram 2D Echocardiogram has been performed.  Stacey Drain 08/18/2016, 11:21 AM

## 2016-08-20 ENCOUNTER — Other Ambulatory Visit: Payer: Self-pay

## 2016-08-20 MED ORDER — FUROSEMIDE 40 MG PO TABS: 80.0000 mg | ORAL_TABLET | Freq: Two times a day (BID) | ORAL | 3 refills | Status: DC

## 2016-08-22 ENCOUNTER — Ambulatory Visit (INDEPENDENT_AMBULATORY_CARE_PROVIDER_SITE_OTHER): Payer: Medicare Other | Admitting: Adult Health

## 2016-08-22 ENCOUNTER — Encounter: Payer: Self-pay | Admitting: Adult Health

## 2016-08-22 VITALS — BP 122/56 | HR 49 | Ht 72.0 in | Wt 202.0 lb

## 2016-08-22 DIAGNOSIS — J90 Pleural effusion, not elsewhere classified: Secondary | ICD-10-CM | POA: Diagnosis not present

## 2016-08-22 DIAGNOSIS — I10 Essential (primary) hypertension: Secondary | ICD-10-CM

## 2016-08-22 DIAGNOSIS — I509 Heart failure, unspecified: Secondary | ICD-10-CM

## 2016-08-22 DIAGNOSIS — I4819 Other persistent atrial fibrillation: Secondary | ICD-10-CM

## 2016-08-22 DIAGNOSIS — I481 Persistent atrial fibrillation: Secondary | ICD-10-CM

## 2016-08-22 DIAGNOSIS — I2510 Atherosclerotic heart disease of native coronary artery without angina pectoris: Secondary | ICD-10-CM

## 2016-08-22 MED ORDER — METOLAZONE 2.5 MG PO TABS: 2.5000 mg | ORAL_TABLET | Freq: Every day | ORAL | 0 refills | Status: DC

## 2016-08-22 MED ORDER — LOSARTAN POTASSIUM 50 MG PO TABS: 50.0000 mg | ORAL_TABLET | Freq: Every day | ORAL | 3 refills | Status: DC

## 2016-08-22 NOTE — Addendum Note (Signed)
Addended by: Kerney Elbe on: 08/22/2016 04:49 PM   Modules accepted: Orders

## 2016-08-22 NOTE — Patient Instructions (Signed)
Your physician recommends that you schedule a follow-up appointment in: 1 Week with Joni Reining, NP   Your physician has recommended you make the following change in your medication:   Continue Lasix 80 mg Two Times Daily  Metolazone 2.5 mg Daily for 3 Days   Your physician recommends that you return for lab work Just before next visit.   If you need a refill on your cardiac medications before your next appointment, please call your pharmacy.  Thank you for choosing [facility]!

## 2016-08-22 NOTE — Progress Notes (Signed)
Cardiology Office Note   Date:  08/22/2016   ID:  Ryan [name], DOB 12/22/1932, MRN [id number]  PCP:  Kirstie Peri, MD  Cardiologist: [place]/  Joni Reining, NP   No chief complaint on file.     History of Present Illness: Ryan [name] is a 83 y.o. male who presents for ongoing assessment and management of coronary artery disease, nonobstructive per evaluation 2005, COPD, hypertension,coaorse atrial fib, no anticoagulation  chronic right bundle branch block, with type 2 diabetes. The patient was last seen in the office on 08/15/2016 by Dr. Diona Browner. At that time the patient was complaining of progressive dyspnea and leg edema since being hospitalized in February 2018. He had evidence of significant volume overload and potentially a large right pleural effusion on examination. The patient was started on increased doses of Lasix 80 mg twice a day, with a follow-up echocardiogram to reassess LV function. If necessary the patient may have to be hospitalized for IV diureses, if he fails outpatient treatment.  Echocardiogram 08/18/2016 Left ventricle: The cavity size was normal. Wall thickness was at   the upper limits of normal. Systolic function was normal. The   estimated ejection fraction was in the range of 60% to 65%. Wall   motion was normal; there were no regional wall motion   abnormalities. The study is not technically sufficient to allow   evaluation of LV diastolic function. - Aortic valve: Mildly calcified annulus. Trileaflet; mildly   calcified leaflets. - Mitral valve: Calcified annulus. There was trivial regurgitation. - Right atrium: The atrium was mildly dilated. Central venous   pressure (est): 8 mm Hg. - Tricuspid valve: There was mild regurgitation. - Pulmonary arteries: PA peak pressure: 46 mm Hg (S). - Pericardium, extracardiac: A small pericardial effusion was   identified circumferential to the heart with small to moderate   collection posteriorly.  Today on  follow-up the patient has lost 2 pounds since increasing dose of Lasix. He continues to eat salted foods and add salt to his foods, despite education otherwise. The patient does bring with him his blood pressure and weight, his home weights run between 195-197, that are 3 pounds different from our scale. He is symptomatically better, but continues to have evidence of significant fluid overload in the lower extremities. He is unable to lay flat is a lay on his left side comfortably when he is asleep. He denies any worsening of dyspnea, in fact he states he feels some better.  Past Medical History:  Diagnosis Date  . Allergic rhinitis   . CAD (coronary artery disease)    Mild nonobstructive disease 2005  . Calculus of kidney   . Colon polyp   . COPD (chronic obstructive pulmonary disease) (HCC)   . Dyslipidemia   . Essential hypertension   . Fatty liver   . History of cardiomyopathy    Normalization of LVEF on medical therapy  . History of prostatitis   . Osteoarthritis   . Right bundle branch block   . Type 2 diabetes mellitus (HCC)     Past Surgical History:  Procedure Laterality Date  . COLONOSCOPY    . HEMICOLECTOMY       Current Outpatient Prescriptions  Medication Sig Dispense Refill  . amLODipine (NORVASC) 10 MG tablet Take 10 mg by mouth daily.    Marland Kitchen aspirin 81 MG tablet Take 81 mg by mouth every evening.    . diphenhydramine-acetaminophen (TYLENOL PM) 25-500 MG TABS tablet Take 1 tablet by mouth at bedtime  as needed.    . furosemide (LASIX) 40 MG tablet Take 2 tablets (80 mg total) by mouth 2 (two) times daily. 360 tablet 3  . losartan (COZAAR) 100 MG tablet Take 1 tablet (100 mg total) by mouth daily. 30 tablet 3  . metFORMIN (GLUCOPHAGE) 500 MG tablet Take 500 mg by mouth 3 (three) times daily.     . metoprolol succinate (TOPROL-XL) 50 MG 24 hr tablet Take 1 tablet by mouth daily.    . Omega-3 Fatty Acids (FISH OIL) 1000 MG CAPS Take 1 capsule by mouth 2 (two) times  daily.     . potassium chloride (KLOR-CON 10) 10 MEQ tablet Take 10 mEq by mouth 2 (two) times daily.    . predniSONE (DELTASONE) 20 MG tablet Take 1 tabs po daily x 5 days 5 tablet 0  . simvastatin (ZOCOR) 40 MG tablet Take 40 mg by mouth every evening.    . Tamsulosin HCl (FLOMAX) 0.4 MG CAPS Take 0.4 mg by mouth every morning.     . vitamin B-12 (CYANOCOBALAMIN) 500 MCG tablet Take 500 mcg by mouth daily.    . metolazone (ZAROXOLYN) 2.5 MG tablet Take 1 tablet (2.5 mg total) by mouth daily. 5 tablet 0   No current facility-administered medications for this visit.     Allergies:   Patient has no known allergies.    Social History:  The patient  reports that he quit smoking about 31 years ago. His smoking use included Cigarettes. He started smoking about 63 years ago. He has a 80.00 pack-year smoking history. He has never used smokeless tobacco. He reports that he does not drink alcohol.   Family History:  The patient's family history is not on file.    ROS: All other systems are reviewed and negative. Unless otherwise mentioned in H&P    PHYSICAL EXAM: VS:  BP (!) 122/56   Pulse (!) 49   Ht 6' (1.829 m)   Wt 202 lb (91.6 kg)   SpO2 94%   BMI 27.40 kg/m  , BMI Body mass index is 27.4 kg/m. GEN: Well nourished, well developed, in no acute distress  HEENT: normal  Neck: no JVD, carotid bruits, or masses Cardiac: RRR; no murmurs, rubs, or gallops Respiratory:  clear to auscultation In the upper lobes, absent in the bases, right greater than left  GI: soft, nontender, nondistended, + BS MS: no deformity or atrophy 2+ to 3+ pitting edema pretibially to the feet and ankles. Skin: warm and dry, no rash Neuro:  Strength and sensation are intact Psych: euthymic mood, full affect   Recent Labs: 05/29/2016: B Natriuretic Peptide 166.0 05/30/2016: ALT 26; Hemoglobin 11.6; Platelets 84 08/15/2016: BUN 25; Creatinine, Ser 1.46; Potassium 3.6; Sodium 140    Lipid Panel No results found  for: CHOL, TRIG, HDL, CHOLHDL, VLDL, LDLCALC, LDLDIRECT    Wt Readings from Last 3 Encounters:  08/22/16 202 lb (91.6 kg)  08/15/16 200 lb (90.7 kg)  05/29/16 196 lb (88.9 kg)      Other studies Reviewed: CXR: 08/15/2016.  The heart is normal in size and stable. Stable tortuosity and calcification of the thoracic aorta. There is a persistent moderate-sized right pleural effusion and a small left pleural effusion. No pulmonary edema. No definite infiltrates. There is atelectasis overlying the effusions. No worrisome pulmonary lesions. The bony thorax is intact.  IMPRESSION: Persistent bilateral pleural effusions, right greater than left with overlying atelectasis.  ASSESSMENT AND PLAN:  1.  Nonischemic cardiomyopathy: Most recent  echocardiogram as above with normal ejection fraction unable to assess for diastolic dysfunction in the setting of atrial fibrillation. The patient continues to have evidence of fluid overload although he reports he is symptomatically better. His weight is only minimally changed. He continues to eat salted foods, and has not changed his diet as directed.  I spoken with Dr. Diona Browner who is here in the office today. He has reviewed his chest x-ray, echocardiogram, and labs. It would be best if the patient were hospitalized to receive IV diuresis, and also to have right pleural effusion addressed with thoracentesis. The patient is not in favor of hospitalization at this time. The other option would be to begin metolazone 2.5 mg daily for 3 days in addition to his current medication regimen. Have him follow-up in 5 days to evaluate his symptoms.  This has been discussed with the patient who does not wish to be admitted, and prefers outpatient therapy. I've explained to him that it may be necessary to have him hospitalized should he fail this therapy. If he does have to be hospitalized he chooses to go to [facility].  He will have a follow-up BMET prior to  being seen in 5 days in [place]. He is again counseled on a low-sodium diet.  2. Right pleural effusion: The patient will likely need to have a thoracentesis as this has not improved with diuresis. This is been explained to the patient, and will likely need to be completed during hospitalization. He verbalizes understanding. He was not placed on anticoagulation therapy for coarse atrial fibrillation, as thoracentesis was likely required.  3. Atrial fibrillation: On auscultation heart rate is regular and bradycardic. Continue aspirin.  4. Hypertension: He is on amlodipine and losartan. Most recent creatinine 1.46. We'll continue these medications, follow-up BMET after 4 days to be reviewed on follow-up appointment.  5. Diabetes: Patient's blood glucose is ranging between 95 and 137. He will need to follow-up with primary care for ongoing management.  Current medicines are reviewed at length with the patient today.    Labs/ tests ordered today include:   Orders Placed This Encounter  Procedures  . Basic Metabolic Panel (BMET)   I have spent greater than 45 minutes with the patient and family, along with consultation with Dr. Diona Browner concerning treatment regimen.   Disposition:   FU with Cardiology, Wednesday, May 3 in need in [place].   Signed, Joni Reining, NP  08/22/2016 4:14 PM    [facility] [address] [place], [place] [address] Phone: [phone]; Fax: [phone]

## 2016-08-26 ENCOUNTER — Ambulatory Visit: Payer: Medicare Other | Admitting: Cardiology

## 2016-08-27 ENCOUNTER — Encounter: Payer: Self-pay | Admitting: Adult Health

## 2016-08-27 ENCOUNTER — Ambulatory Visit (INDEPENDENT_AMBULATORY_CARE_PROVIDER_SITE_OTHER): Payer: Medicare Other | Admitting: Adult Health

## 2016-08-27 ENCOUNTER — Encounter: Payer: Self-pay | Admitting: *Deleted

## 2016-08-27 VITALS — BP 113/57 | HR 64 | Ht 72.0 in | Wt 191.0 lb

## 2016-08-27 DIAGNOSIS — I5032 Chronic diastolic (congestive) heart failure: Secondary | ICD-10-CM

## 2016-08-27 DIAGNOSIS — I2510 Atherosclerotic heart disease of native coronary artery without angina pectoris: Secondary | ICD-10-CM

## 2016-08-27 DIAGNOSIS — I3139 Other pericardial effusion (noninflammatory): Secondary | ICD-10-CM

## 2016-08-27 DIAGNOSIS — I10 Essential (primary) hypertension: Secondary | ICD-10-CM

## 2016-08-27 DIAGNOSIS — J90 Pleural effusion, not elsewhere classified: Secondary | ICD-10-CM

## 2016-08-27 DIAGNOSIS — I313 Pericardial effusion (noninflammatory): Secondary | ICD-10-CM

## 2016-08-27 LAB — BASIC METABOLIC PANEL
BUN: 44 mg/dL — AB (ref 7–25)
CHLORIDE: 94 mmol/L — AB (ref 98–110)
CO2: 30 mmol/L (ref 20–31)
Calcium: 9.4 mg/dL (ref 8.6–10.3)
Creat: 2.75 mg/dL — ABNORMAL HIGH (ref 0.70–1.11)
Glucose, Bld: 105 mg/dL — ABNORMAL HIGH (ref 65–99)
POTASSIUM: 3.8 mmol/L (ref 3.5–5.3)
SODIUM: 139 mmol/L (ref 135–146)

## 2016-08-27 MED ORDER — LOSARTAN POTASSIUM 25 MG PO TABS: 25.0000 mg | ORAL_TABLET | Freq: Every day | ORAL | 3 refills | Status: AC

## 2016-08-27 MED ORDER — FUROSEMIDE 40 MG PO TABS: 80.0000 mg | ORAL_TABLET | Freq: Every day | ORAL | 3 refills | Status: AC

## 2016-08-27 NOTE — Progress Notes (Signed)
Cardiology Office Note   Date:  08/27/2016   ID:  Ryan [name], DOB 1932-10-10, MRN [id number]  PCP:  Kirstie Peri, MD  Cardiologist: Diona Browner MD  Chief Complaint  Patient presents with  . Shortness of Breath    Pleural effusion   . Leg Swelling      History of Present Illness: Ryan [name] is a 83 y.o. male who presents for close follow up after being seen in the office one week ago with worsening breathing status, LEE, and hx of pleural effusion. At that time discussion of need for admission was had, but he refused. We started metolazone 2.5 mg for 3 days, cut back on Benicar/HCTZ to avoid hypotension. Repeated CXR and BMET. Other history includes atrial fib, hypertension and diabetes, along with COPD.   Repeat labs revealed Na 139, Creatinine of 2.75,   He comes today with 10 lbs weight loss. His edema is resolved, breathing status is improved, with O2 sat 100%. He has more energy and has stopped salted foods. His only complaint is that of dizziness at times. He denies cramping or palpitations.   Past Medical History:  Diagnosis Date  . Allergic rhinitis   . CAD (coronary artery disease)    Mild nonobstructive disease 2005  . Calculus of kidney   . Colon polyp   . COPD (chronic obstructive pulmonary disease) (HCC)   . Dyslipidemia   . Essential hypertension   . Fatty liver   . History of cardiomyopathy    Normalization of LVEF on medical therapy  . History of prostatitis   . Osteoarthritis   . Right bundle branch block   . Type 2 diabetes mellitus (HCC)     Past Surgical History:  Procedure Laterality Date  . COLONOSCOPY    . HEMICOLECTOMY       Current Outpatient Prescriptions  Medication Sig Dispense Refill  . amLODipine (NORVASC) 10 MG tablet Take 10 mg by mouth daily.    Marland Kitchen aspirin 81 MG tablet Take 81 mg by mouth every evening.    . diphenhydramine-acetaminophen (TYLENOL PM) 25-500 MG TABS tablet Take 1 tablet by mouth at bedtime as needed.    . furosemide  (LASIX) 40 MG tablet Take 2 tablets (80 mg total) by mouth 2 (two) times daily. 360 tablet 3  . metFORMIN (GLUCOPHAGE) 500 MG tablet Take 500 mg by mouth 3 (three) times daily.     . metoprolol succinate (TOPROL-XL) 50 MG 24 hr tablet Take 1 tablet by mouth daily.    . Omega-3 Fatty Acids (FISH OIL) 1000 MG CAPS Take 1 capsule by mouth 2 (two) times daily.     . potassium chloride (KLOR-CON 10) 10 MEQ tablet Take 10 mEq by mouth 2 (two) times daily.    . predniSONE (DELTASONE) 20 MG tablet Take 1 tabs po daily x 5 days 5 tablet 0  . simvastatin (ZOCOR) 40 MG tablet Take 40 mg by mouth every evening.    . Tamsulosin HCl (FLOMAX) 0.4 MG CAPS Take 0.4 mg by mouth every morning.     . vitamin B-12 (CYANOCOBALAMIN) 500 MCG tablet Take 500 mcg by mouth daily.     No current facility-administered medications for this visit.     Allergies:   Patient has no known allergies.    Social History:  The patient  reports that he quit smoking about 31 years ago. His smoking use included Cigarettes. He started smoking about 63 years ago. He has a 80.00 pack-year smoking history. He  has never used smokeless tobacco. He reports that he does not drink alcohol.   Family History:  The patient's family history is not on file.    ROS: All other systems are reviewed and negative. Unless otherwise mentioned in H&P    PHYSICAL EXAM: VS:  BP (!) 113/57   Pulse 64   Ht 6' (1.829 m)   Wt 191 lb (86.6 kg)   SpO2 100% Comment: on room air  BMI 25.90 kg/m  , BMI Body mass index is 25.9 kg/m. GEN: Well nourished, well developed, in no acute distress  HEENT: normal  Neck: no JVD, carotid bruits, or masses Cardiac: RRR; no murmurs, rubs, or gallops, no edema  Respiratory: Clear to ascultation, slightly diminished on the right. No wheezes.  GI: soft, nontender, nondistended, + BS MS: no deformity or atrophy  Skin: warm and dry, no rash Neuro:  Strength and sensation are intact Psych: euthymic mood, full  affect   Recent Labs: 05/29/2016: B Natriuretic Peptide 166.0 05/30/2016: ALT 26; Hemoglobin 11.6; Platelets 84 08/26/2016: BUN 44; Creat 2.75; Potassium 3.8; Sodium 139    Lipid Panel No results found for: CHOL, TRIG, HDL, CHOLHDL, VLDL, LDLCALC, LDLDIRECT    Wt Readings from Last 3 Encounters:  08/27/16 191 lb (86.6 kg)  08/22/16 202 lb (91.6 kg)  08/15/16 200 lb (90.7 kg)      Other studies Reviewed:  08/18/2016  Left ventricle: The cavity size was normal. Wall thickness was at   the upper limits of normal. Systolic function was normal. The   estimated ejection fraction was in the range of 60% to 65%. Wall   motion was normal; there were no regional wall motion   abnormalities. The study is not technically sufficient to allow   evaluation of LV diastolic function. - Aortic valve: Mildly calcified annulus. Trileaflet; mildly   calcified leaflets. - Mitral valve: Calcified annulus. There was trivial regurgitation. - Right atrium: The atrium was mildly dilated. Central venous   pressure (est): 8 mm Hg. - Tricuspid valve: There was mild regurgitation. - Pulmonary arteries: PA peak pressure: 46 mm Hg (S). - Pericardium, extracardiac: A small pericardial effusion was   identified circumferential to the heart with small to moderate  ASSESSMENT AND PLAN:  1. Acute on Chronic Diastolic CHF: He has had excellent response to diuretics. Weight loss of 10 lbs. Creatinine is markedly elevated on labs. Will decrease lasix to 80 mg in am only. Decrease losartan from 50 mg daily to 25 mg daily. He will start the losartan tomorrow evening, as he takes it at HS. Will repeat his BMET in two weeks.  2. Pleural Effusion: Repeat CXR. He did not have this completed prior to this visit.   3. Hypertension: His BP is soft. Likely from over diureses. He will decrease losartan to 25 mg daily at HS starting tomorrow. He will record his BP at home and then bring with him to next appointment. He will bring  his BP cuff with him to calibrate with our BP readings. Continue metoprolol as directed. Can consider decreasing amlodipine to 5 mg if necessary. Will assess at next appointment.  4. Diabetes: Followed by PCP.       Current medicines are reviewed at length with the patient today.    Labs/ tests ordered today include:  No orders of the defined types were placed in this encounter.    Disposition:   FU with cardiology in 2 weeks. Dr. Diona Browner or in [place]   Signed,  Joni Reining, NP  08/27/2016 4:01 PM    [facility] [address] [place], [place] [address] Phone: [phone]; Fax: [phone]

## 2016-08-27 NOTE — Patient Instructions (Signed)
Medication Instructions:   Your physician has recommended you make the following change in your medication:   Decrease furosemide to 80 mg daily. Do not take any more pills today.  Decrease losartan to 25 mg daily. Start this dose in the morning.  Continue all other medications the same.  Labwork:  Your physician recommends that you return for lab work in 2 weeks just before your next visit to check your BMET. Your lab work order has been given to you today.  Testing/Procedures:  A chest x-ray takes a picture of the organs and structures inside the chest, including the heart, lungs, and blood vessels. This test can show several things, including, whether the heart is enlarges; whether fluid is building up in the lungs; and whether pacemaker / defibrillator leads are still in place. Please have this done tomorrow at [facility].  Follow-Up:  Your physician recommends that you schedule a follow-up appointment in: 2 weeks.  Any Other Special Instructions Will Be Listed Below (If Applicable).  If you need a refill on your cardiac medications before your next appointment, please call your pharmacy.

## 2016-08-28 ENCOUNTER — Ambulatory Visit (HOSPITAL_COMMUNITY)
Admission: RE | Admit: 2016-08-28 | Discharge: 2016-08-28 | Disposition: A | Discharge status: Home / Self Care | Payer: Medicare Other | Source: Ambulatory Visit | Attending: Adult Health | Admitting: Adult Health

## 2016-08-28 DIAGNOSIS — J90 Pleural effusion, not elsewhere classified: Secondary | ICD-10-CM | POA: Insufficient documentation

## 2016-08-28 DIAGNOSIS — I10 Essential (primary) hypertension: Secondary | ICD-10-CM | POA: Diagnosis not present

## 2016-08-28 DIAGNOSIS — I313 Pericardial effusion (noninflammatory): Secondary | ICD-10-CM | POA: Diagnosis not present

## 2016-09-12 ENCOUNTER — Telehealth: Payer: Self-pay

## 2016-09-12 ENCOUNTER — Other Ambulatory Visit: Payer: Self-pay

## 2016-09-12 DIAGNOSIS — I25119 Atherosclerotic heart disease of native coronary artery with unspecified angina pectoris: Secondary | ICD-10-CM

## 2016-09-12 NOTE — Telephone Encounter (Signed)
Patient verbalized understanding of needing to have lab work done. Patient stated he would have his lab work completed today or tomorrow.

## 2016-09-12 NOTE — Telephone Encounter (Signed)
Attempted to call patient multiple times this morning regarding blood work. Patient has appointment schedule for Monday and is supposed to have blood work done prior. Patients phone is busy each time and unable to leave message.

## 2016-09-12 NOTE — Progress Notes (Signed)
Cardiology Office Note  Date: 09/15/2016   ID: Ryan [name], DOB 02-18-1933, MRN [id number]  PCP: Ryan Peri, MD  Primary Cardiologist: Ryan Dell, MD   Chief Complaint  Patient presents with  . Cardiac follow-up    History of Present Illness: Ryan [name] is an 83 y.o. male that I last saw in April. He is here today with his daughter. He has had 2 subsequent visits with Ms. Ryan [name]. At his most recent encounter in early May he was feeling better, leg edema had improved, and he had lost about 10 pounds. Creatinine however had increased from 1.4 up to 2.7. He had been on a limited course of metolazone along with Lasix 80 mg twice daily, diuretics were simplified to Lasix 80 mg once daily and he also had decrease in losartan to 25 mg daily.  Today he indicates that his leg edema has improved and he is satisfied with this, also less short of breath. He does mention having trouble with lightheadedness, particularly when he stands, he feels unsteady and has been using a walker. Blood pressure is low normal today. We reviewed his medications and discussed cutting Norvasc down to 5 mg daily..  Follow-up chest x-ray on May 3 showed slight improvement in the right sided pleural effusion, still moderate in size.  Follow-up BMET obtained recently showed improvement in creatinine from 2.75 down to 1.91. Potassium normal at 4.4.  Past Medical History:  Diagnosis Date  . Allergic rhinitis   . CAD (coronary artery disease)    Mild nonobstructive disease 2005  . Calculus of kidney   . Colon polyp   . COPD (chronic obstructive pulmonary disease) (HCC)   . Dyslipidemia   . Essential hypertension   . Fatty liver   . History of cardiomyopathy    Normalization of LVEF on medical therapy  . History of prostatitis   . Osteoarthritis   . Right bundle branch block   . Type 2 diabetes mellitus (HCC)     Past Surgical History:  Procedure Laterality Date  . COLONOSCOPY    .  HEMICOLECTOMY      Current Outpatient Prescriptions  Medication Sig Dispense Refill  . aspirin 81 MG tablet Take 81 mg by mouth every evening.    . diphenhydramine-acetaminophen (TYLENOL PM) 25-500 MG TABS tablet Take 1 tablet by mouth at bedtime as needed.    . furosemide (LASIX) 40 MG tablet Take 2 tablets (80 mg total) by mouth daily. 180 tablet 3  . losartan (COZAAR) 25 MG tablet Take 1 tablet (25 mg total) by mouth daily. 90 tablet 3  . metFORMIN (GLUCOPHAGE) 500 MG tablet Take 500 mg by mouth 3 (three) times daily.     . metoprolol succinate (TOPROL-XL) 50 MG 24 hr tablet Take 1 tablet by mouth daily.    . Omega-3 Fatty Acids (FISH OIL) 1000 MG CAPS Take 1 capsule by mouth 2 (two) times daily.     . potassium chloride (KLOR-CON 10) 10 MEQ tablet Take 10 mEq by mouth 2 (two) times daily.    . predniSONE (DELTASONE) 20 MG tablet Take 1 tabs po daily x 5 days 5 tablet 0  . simvastatin (ZOCOR) 40 MG tablet Take 40 mg by mouth every evening.    . Tamsulosin HCl (FLOMAX) 0.4 MG CAPS Take 0.4 mg by mouth every morning.     . vitamin B-12 (CYANOCOBALAMIN) 500 MCG tablet Take 500 mcg by mouth daily.    Marland Kitchen amLODipine (NORVASC) 5  MG tablet Take 1 tablet (5 mg total) by mouth daily. 90 tablet 3   No current facility-administered medications for this visit.    Allergies:  Patient has no known allergies.   Social History: The patient  reports that he quit smoking about 31 years ago. His smoking use included Cigarettes. He started smoking about 63 years ago. He has a 80.00 pack-year smoking history. He has never used smokeless tobacco. He reports that he does not drink alcohol.   ROS:  Please see the history of present illness. Otherwise, complete review of systems is positive for decreased hearing, memory trouble, lightheadedness.  All other systems are reviewed and negative.   Physical Exam: VS:  BP (!) 109/54   Pulse (!) 52   Ht 6' (1.829 m)   Wt 186 lb 9.6 oz (84.6 kg)   SpO2 99%   BMI  25.31 kg/m , BMI Body mass index is 25.31 kg/m.  Wt Readings from Last 3 Encounters:  09/15/16 186 lb 9.6 oz (84.6 kg)  08/27/16 191 lb (86.6 kg)  08/22/16 202 lb (91.6 kg)    General: Elderly male, no distress. Using a walker. HEENT: Conjunctiva and lids normal, oropharynx clear. Neck: Supple, no elevated JVP or carotid bruits, no thyromegaly. Lungs: Decreased breath sounds right base, nonlabored breathing at rest. Cardiac: Irregular, no S3, soft systolic murmur, no pericardial rub. Abdomen: Soft, protuberant, bowel sounds present. Extremities: Mild ankle edema, distal pulses 1-2+. Skin: Warm and dry. Musculoskeletal: No kyphosis. Neuropsychiatric: Alert and oriented x3, affect grossly appropriate.  ECG: I personally reviewed the tracing from 08/15/2016 which showed atrial fibrillation/flutter with controlled ventricular response and right bundle branch block.  Recent Labwork: 05/29/2016: B Natriuretic Peptide 166.0 05/30/2016: ALT 26; AST 58; Hemoglobin 11.6; Platelets 84 09/12/2016: BUN 33; Creat 1.91; Potassium 4.4; Sodium 143   Other Studies Reviewed Today:  Echocardiogram 08/18/2016: Study Conclusions  - Left ventricle: The cavity size was normal. Wall thickness was at   the upper limits of normal. Systolic function was normal. The   estimated ejection fraction was in the range of 60% to 65%. Wall   motion was normal; there were no regional wall motion   abnormalities. The study is not technically sufficient to allow   evaluation of LV diastolic function. - Aortic valve: Mildly calcified annulus. Trileaflet; mildly   calcified leaflets. - Mitral valve: Calcified annulus. There was trivial regurgitation. - Right atrium: The atrium was mildly dilated. Central venous   pressure (est): 8 mm Hg. - Tricuspid valve: There was mild regurgitation. - Pulmonary arteries: PA peak pressure: 46 mm Hg (S). - Pericardium, extracardiac: A small pericardial effusion was   identified  circumferential to the heart with small to moderate   collection posteriorly.  Impressions:  - Upper normal LV wall thickness with LVEF 60-65%. Indeterminate   diastolic function. Mildly calcified mitral annulus with trivial   mitral regurgitation. Mildly sclerotic aortic valve. Mild   tricuspid regurgitation with PASP estimated 46 mmHg. Small   circumferential pericardial effusion noted with small to moderate   collection posteriorly.  Assessment and Plan:  1. Volume overload with right pleural effusion and leg edema, chronically improved after increasing diuretics. He is now on Lasix 80 mg daily and his creatinine has come down from 2.7-1.9. On examination his right pleural effusion does sound better and his leg edema has significantly improved. LVEF normal range at 60-65%. Suspect at least a component of diastolic dysfunction, particularly with his concurrent atrial arrhythmia.  2. Lightheadedness,  blood pressure low normal range. I have recommended that he cut his Norvasc back to 5 mg daily.  3. Acute on chronic renal insufficiency, creatinine improved from 2.7 down to 1.9 with reduction in diuretics. Follow-up BMET for his next visit.  4. Persistent atrial fibrillation/flutter. Patient is asymptomatic in terms of palpitations. Heart rate is controlled on Toprol-XL. CHADSVASC score is 5 and we have discussed the possibility of anticoagulation, however I am hesitant to start in light of his recent unsteadiness and being prone to falls. He was most comfortable staying on aspirin for now after discussion, although still does remain at risk for stroke.  Current medicines were reviewed with the patient today.   Orders Placed This Encounter  Procedures  . Basic Metabolic Panel (BMET)    Disposition: Follow-up in 3 months.  Signed, Jonelle Sidle, MD, [facility] 09/15/2016 1:35 PM    [facility] at [facility] [address] [place], [place], [place] [address] Phone: [phone]; Fax: [phone]

## 2016-09-13 LAB — BASIC METABOLIC PANEL
BUN: 33 mg/dL — AB (ref 7–25)
CALCIUM: 9.6 mg/dL (ref 8.6–10.3)
CO2: 29 mmol/L (ref 20–31)
CREATININE: 1.91 mg/dL — AB (ref 0.70–1.11)
Chloride: 103 mmol/L (ref 98–110)
Glucose, Bld: 108 mg/dL — ABNORMAL HIGH (ref 65–99)
Potassium: 4.4 mmol/L (ref 3.5–5.3)
Sodium: 143 mmol/L (ref 135–146)

## 2016-09-15 ENCOUNTER — Encounter: Payer: Self-pay | Admitting: Cardiology

## 2016-09-15 ENCOUNTER — Ambulatory Visit (INDEPENDENT_AMBULATORY_CARE_PROVIDER_SITE_OTHER): Payer: Medicare Other | Admitting: Cardiology

## 2016-09-15 VITALS — BP 109/54 | HR 52 | Ht 72.0 in | Wt 186.6 lb

## 2016-09-15 DIAGNOSIS — I481 Persistent atrial fibrillation: Secondary | ICD-10-CM | POA: Diagnosis not present

## 2016-09-15 DIAGNOSIS — N189 Chronic kidney disease, unspecified: Secondary | ICD-10-CM | POA: Diagnosis not present

## 2016-09-15 DIAGNOSIS — N289 Disorder of kidney and ureter, unspecified: Secondary | ICD-10-CM

## 2016-09-15 DIAGNOSIS — I4819 Other persistent atrial fibrillation: Secondary | ICD-10-CM

## 2016-09-15 DIAGNOSIS — I25119 Atherosclerotic heart disease of native coronary artery with unspecified angina pectoris: Secondary | ICD-10-CM | POA: Diagnosis not present

## 2016-09-15 DIAGNOSIS — R600 Localized edema: Secondary | ICD-10-CM | POA: Diagnosis not present

## 2016-09-15 DIAGNOSIS — J90 Pleural effusion, not elsewhere classified: Secondary | ICD-10-CM | POA: Diagnosis not present

## 2016-09-15 DIAGNOSIS — R42 Dizziness and giddiness: Secondary | ICD-10-CM

## 2016-09-15 MED ORDER — AMLODIPINE BESYLATE 5 MG PO TABS: 5.0000 mg | ORAL_TABLET | Freq: Every day | ORAL | 3 refills | Status: AC

## 2016-09-15 NOTE — Patient Instructions (Signed)
Medication Instructions:  BEGIN TAKING NORVASC 5 MG DAILY CONTINUE ALL OTHER MEDICATIONS AS PRESCRIBED  Labwork: BMET WITH 3 MONTH FOLLOW UP   Testing/Procedures: NONE  Follow-Up: Your physician recommends that you schedule a follow-up appointment in 3 MONTHS WITH DR. MCDOWELL  Any Other Special Instructions Will Be Listed Below (If Applicable).  If you need a refill on your cardiac medications before your next appointment, please call your pharmacy.

## 2016-12-15 ENCOUNTER — Other Ambulatory Visit: Payer: Self-pay

## 2016-12-15 ENCOUNTER — Other Ambulatory Visit: Payer: Self-pay | Admitting: *Deleted

## 2016-12-15 DIAGNOSIS — I25119 Atherosclerotic heart disease of native coronary artery with unspecified angina pectoris: Secondary | ICD-10-CM

## 2016-12-15 DIAGNOSIS — I10 Essential (primary) hypertension: Secondary | ICD-10-CM

## 2016-12-15 NOTE — Progress Notes (Signed)
Cardiology Office Note  Date: 12/16/2016   ID: Ryan [name], DOB [birth year], MRN [id number]  PCP: Kirstie Peri, MD  Primary Cardiologist: Nona Dell, MD   Chief Complaint  Patient presents with  . History of cardiomyopathy    History of Present Illness: Ryan [name] is an 83 y.o. male last seen in May. He is here today with his wife for a follow-up visit. He states that he has been feeling reasonable well. No unusual shortness of breath, no chest pain or progressive leg swelling. His weight is down a few more pounds.  At the last visit we cut Norvasc down to 5 mg daily. I reviewed his medications which also include aspirin, Lasix, Cozaar, Toprol-XL at 50 mg daily, potassium, and Zocor.  I reviewed his recent follow-up lab work which shows improvement in renal function, creatinine down from 1.9 in May to 1.35. Potassium was normal at 4.0. We discussed these results today.  I personally reviewed his ECG today which shows coarse atrial fibrillation/atypical flutter at 74 bpm with incomplete right bundle branch block.  Past Medical History:  Diagnosis Date  . Allergic rhinitis   . CAD (coronary artery disease)    Mild nonobstructive disease 2005  . Calculus of kidney   . Colon polyp   . COPD (chronic obstructive pulmonary disease) (HCC)   . Dyslipidemia   . Essential hypertension   . Fatty liver   . History of cardiomyopathy    Normalization of LVEF on medical therapy  . History of prostatitis   . Osteoarthritis   . Right bundle branch block   . Type 2 diabetes mellitus (HCC)     Past Surgical History:  Procedure Laterality Date  . COLONOSCOPY    . HEMICOLECTOMY      Current Outpatient Prescriptions  Medication Sig Dispense Refill  . aspirin 81 MG tablet Take 81 mg by mouth every evening.    . diphenhydramine-acetaminophen (TYLENOL PM) 25-500 MG TABS tablet Take 1 tablet by mouth at bedtime as needed.    . furosemide (LASIX) 40 MG tablet Take 2 tablets (80 mg  total) by mouth daily. 180 tablet 3  . losartan (COZAAR) 25 MG tablet Take 1 tablet (25 mg total) by mouth daily. 90 tablet 3  . metFORMIN (GLUCOPHAGE) 500 MG tablet Take 500 mg by mouth 3 (three) times daily.     . metoprolol succinate (TOPROL-XL) 50 MG 24 hr tablet Take 1 tablet by mouth daily.    . Omega-3 Fatty Acids (FISH OIL) 1000 MG CAPS Take 1 capsule by mouth 2 (two) times daily.     . potassium chloride (KLOR-CON 10) 10 MEQ tablet Take 10 mEq by mouth 2 (two) times daily.    . simvastatin (ZOCOR) 40 MG tablet Take 40 mg by mouth every evening.    . Tamsulosin HCl (FLOMAX) 0.4 MG CAPS Take 0.4 mg by mouth every morning.     . vitamin B-12 (CYANOCOBALAMIN) 500 MCG tablet Take 500 mcg by mouth daily.    Marland Kitchen amLODipine (NORVASC) 5 MG tablet Take 1 tablet (5 mg total) by mouth daily. (Patient not taking: Reported on 12/16/2016) 90 tablet 3   No current facility-administered medications for this visit.    Allergies:  Patient has no known allergies.   Social History: The patient  reports that he quit smoking about 32 years ago. His smoking use included Cigarettes. He started smoking about 64 years ago. He has a 80.00 pack-year smoking history. He has never  used smokeless tobacco. He reports that he does not drink alcohol.   Family History: The patient's family history is not on file.   ROS:  Please see the history of present illness. Otherwise, complete review of systems is positive for arthritis pains.  All other systems are reviewed and negative.   Physical Exam: VS:  BP (!) 126/50   Pulse 75   Ht 6' (1.829 m)   Wt 190 lb 3.2 oz (86.3 kg)   SpO2 96%   BMI 25.80 kg/m , BMI Body mass index is 25.8 kg/m.  Wt Readings from Last 3 Encounters:  12/16/16 190 lb 3.2 oz (86.3 kg)  09/15/16 186 lb 9.6 oz (84.6 kg)  08/27/16 191 lb (86.6 kg)    General: Elderlymale, no distress. Using a walker. HEENT: Conjunctiva and lids normal, oropharynx clear. Neck: Supple, no elevated JVP or  carotid bruits, no thyromegaly. Lungs: Decreased breath sounds right base, nonlabored breathing at rest. Cardiac: Irregular, no S3, soft systolic murmur, no pericardial rub. Abdomen: Soft, protuberant, bowel sounds present. Extremities: Mild ankle edema, distal pulses 1-2+. Skin: Warm and dry. Musculoskeletal: No kyphosis. Neuropsychiatric: Alert and oriented x3, affect grossly appropriate.  ECG: I personally reviewed the tracing from 08/15/2016 which showed atrial fibrillation/flutter with controlled ventricular response and right bundle branch block.  Recent Labwork: 05/29/2016: B Natriuretic Peptide 166.0 05/30/2016: ALT 26; AST 58; Hemoglobin 11.6; Platelets 84 09/12/2016: BUN 33; Creat 1.91; Potassium 4.4; Sodium 143   Other Studies Reviewed Today:  Echocardiogram 08/18/2016: Study Conclusions  - Left ventricle: The cavity size was normal. Wall thickness was at the upper limits of normal. Systolic function was normal. The estimated ejection fraction was in the range of 60% to 65%. Wall motion was normal; there were no regional wall motion abnormalities. The study is not technically sufficient to allow evaluation of LV diastolic function. - Aortic valve: Mildly calcified annulus. Trileaflet; mildly calcified leaflets. - Mitral valve: Calcified annulus. There was trivial regurgitation. - Right atrium: The atrium was mildly dilated. Central venous pressure (est): 8 mm Hg. - Tricuspid valve: There was mild regurgitation. - Pulmonary arteries: PA peak pressure: 46 mm Hg (S). - Pericardium, extracardiac: A small pericardial effusion was identified circumferential to the heart with small to moderate collection posteriorly.  Impressions:  - Upper normal LV wall thickness with LVEF 60-65%. Indeterminate diastolic function. Mildly calcified mitral annulus with trivial mitral regurgitation. Mildly sclerotic aortic valve. Mild tricuspid regurgitation with PASP  estimated 46 mmHg. Small circumferential pericardial effusion noted with small to moderate collection posteriorly.  Assessment and Plan:  1. Chronic diastolic heart failure. Overall improved fluid status, weight is down 10-12 pounds compared to April. Renal function has also improved on current diuretic regimen, no changes made today.  2. Acute on chronic renal insufficiency, creatinine has now improved from 1.9 down to 1.35.  3. Persistent coarse atrial fibrillation/atypical flutter. Heart rate is adequately controlled on current dose of Toprol-XL. As discussed previously, he prefers to remain on aspirin instead of considering anticoagulation. CHADSVASC score is 5 and he does remain at risk for stroke.  3. History of hypertension, blood pressure is stable today.  Current medicines were reviewed with the patient today.   Orders Placed This Encounter  Procedures  . Basic Metabolic Panel (BMET)    Disposition: Follow-up in 4 month.  Signed, Jonelle Sidle, MD, [facility] 12/16/2016 3:04 PM    [facility] at [facility] [address] [place], [place], [place] [address] Phone: [phone];  Fax: [phone]

## 2016-12-16 ENCOUNTER — Encounter: Payer: Self-pay | Admitting: Cardiology

## 2016-12-16 ENCOUNTER — Ambulatory Visit (INDEPENDENT_AMBULATORY_CARE_PROVIDER_SITE_OTHER): Payer: Medicare Other | Admitting: Cardiology

## 2016-12-16 ENCOUNTER — Telehealth: Payer: Self-pay

## 2016-12-16 VITALS — BP 126/50 | HR 75 | Ht 72.0 in | Wt 190.2 lb

## 2016-12-16 DIAGNOSIS — I481 Persistent atrial fibrillation: Secondary | ICD-10-CM

## 2016-12-16 DIAGNOSIS — N289 Disorder of kidney and ureter, unspecified: Secondary | ICD-10-CM | POA: Diagnosis not present

## 2016-12-16 DIAGNOSIS — R0602 Shortness of breath: Secondary | ICD-10-CM | POA: Diagnosis not present

## 2016-12-16 DIAGNOSIS — I5032 Chronic diastolic (congestive) heart failure: Secondary | ICD-10-CM | POA: Diagnosis not present

## 2016-12-16 DIAGNOSIS — I25119 Atherosclerotic heart disease of native coronary artery with unspecified angina pectoris: Secondary | ICD-10-CM | POA: Diagnosis not present

## 2016-12-16 DIAGNOSIS — I4819 Other persistent atrial fibrillation: Secondary | ICD-10-CM

## 2016-12-16 DIAGNOSIS — I10 Essential (primary) hypertension: Secondary | ICD-10-CM | POA: Diagnosis not present

## 2016-12-16 DIAGNOSIS — N189 Chronic kidney disease, unspecified: Secondary | ICD-10-CM | POA: Diagnosis not present

## 2016-12-16 NOTE — Telephone Encounter (Signed)
Patient notified at office visit. Routed to PCP  

## 2016-12-16 NOTE — Patient Instructions (Signed)
Medication Instructions:  Your physician recommends that you continue on your current medications as directed. Please refer to the Current Medication list given to you today.  Labwork: BMET in 4 months- Orders given today   Testing/Procedures: none  Follow-Up: Your physician wants you to follow-up in: 4 months with Dr. Randa Spike will receive a reminder letter in the mail two months in advance. If you don't receive a letter, please call our office to schedule the follow-up appointment.  Any Other Special Instructions Will Be Listed Below (If Applicable).  If you need a refill on your cardiac medications before your next appointment, please call your pharmacy.

## 2016-12-16 NOTE — Telephone Encounter (Signed)
-----   Message from Eustace Moore, LPN sent at 0/60/0459 12:57 PM EDT -----   ----- Message ----- From: Jonelle Sidle, MD Sent: 12/16/2016  12:55 PM To: Eustace Moore, LPN  Results reviewed. Renal function continues to improve compared to May. Creatinine 1.35 down from 1.9. Potassium normal at 4.0. A copy of this test should be forwarded to Kirstie Peri, MD.

## 2016-12-17 NOTE — Addendum Note (Signed)
Addended by: Norva Pavlov on: 12/17/2016 09:28 AM   Modules accepted: Orders

## 2017-02-12 ENCOUNTER — Ambulatory Visit (INDEPENDENT_AMBULATORY_CARE_PROVIDER_SITE_OTHER): Payer: Medicare Other

## 2017-02-12 DIAGNOSIS — Z23 Encounter for immunization: Secondary | ICD-10-CM

## 2017-03-17 ENCOUNTER — Ambulatory Visit: Payer: Medicare Other | Admitting: Cardiology

## 2017-04-23 IMAGING — CR DG CHEST 1V PORT
1 series · 1 of 1 positions shown · non-contrast
Comparison: Frontal and lateral views 03/14/2016

CLINICAL DATA: Cough, fever and shortness of breath.

EXAM:
PORTABLE CHEST 1 VIEW

[portable]
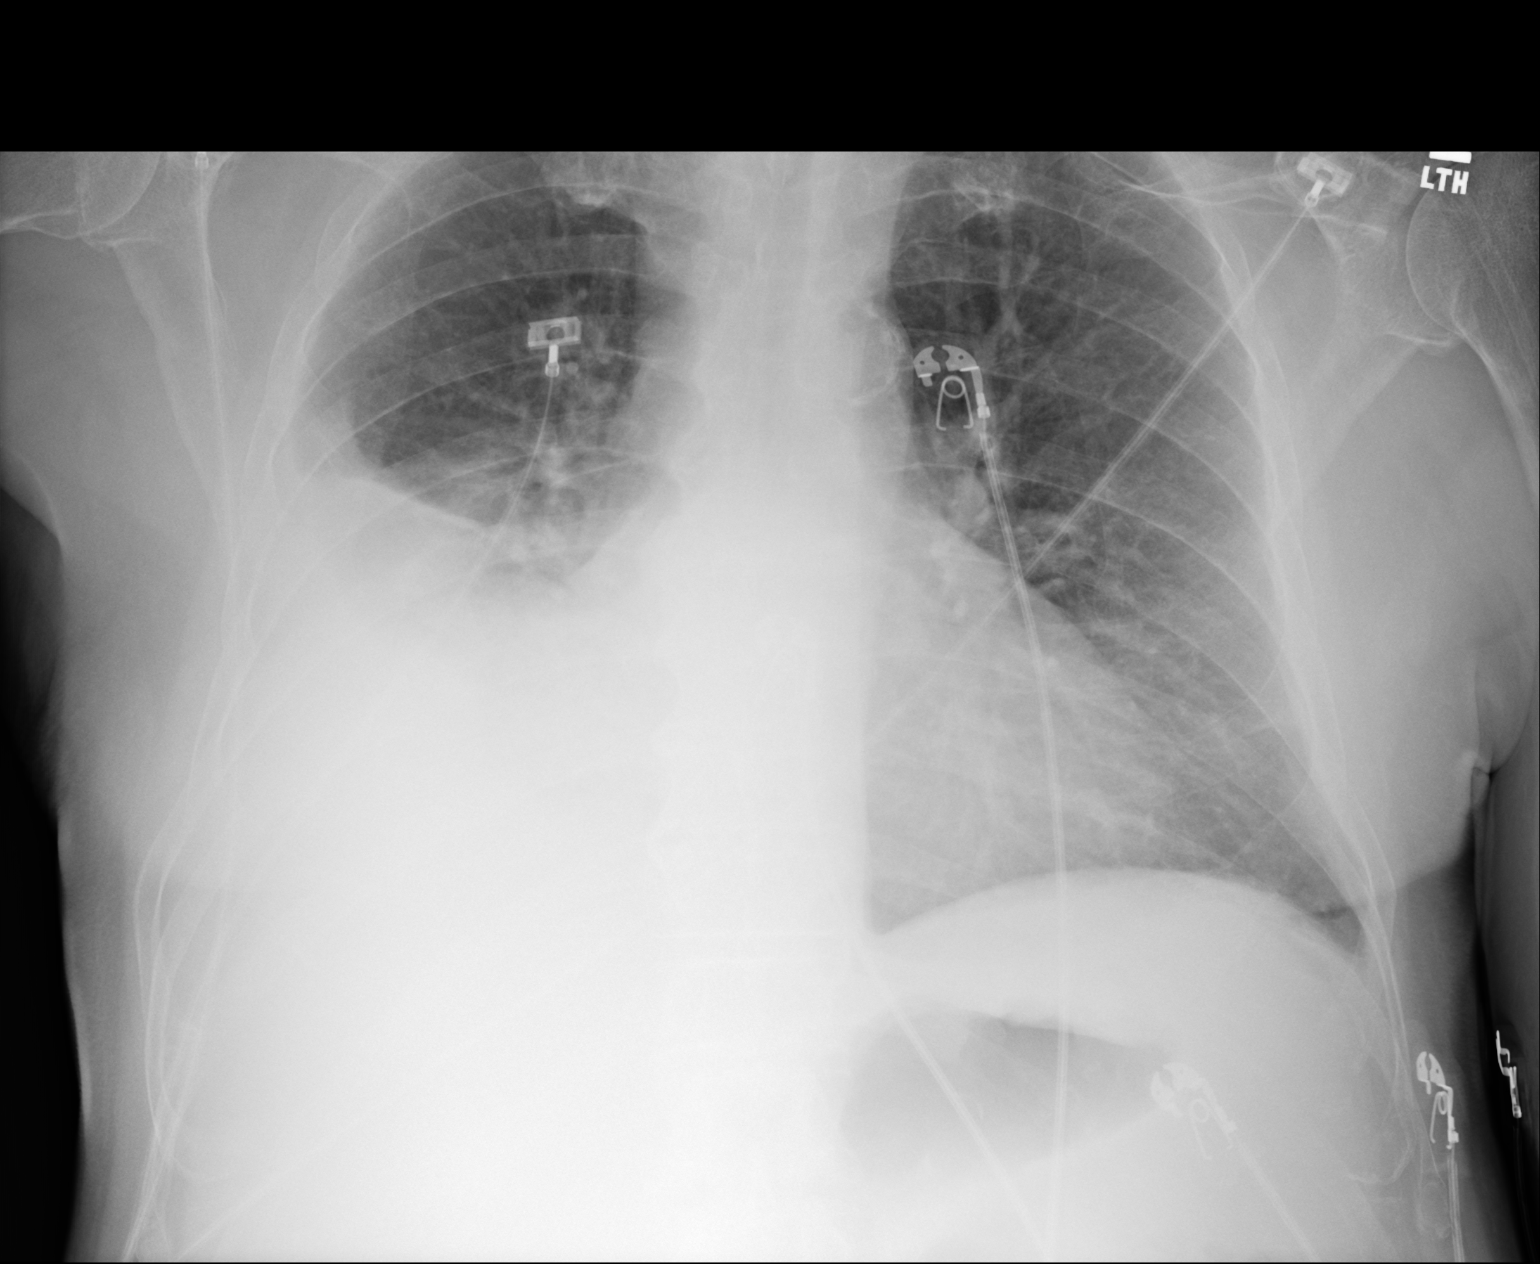

[1 of 1 positions shown; findings below may reference images not displayed]

FINDINGS: Moderate-large right pleural effusion occupying the lower [DATE] of
right hemithorax, likely increased from prior exam. Associated right
basilar opacity. Decreased blunting of left costophrenic angle.
Unchanged heart size and mediastinal contours, no mediastinal shift.
Aortic arch atherosclerosis. No pneumothorax. Stable pulmonary
vasculature. Bronchial thickening again seen.
IMPRESSION: 1. Moderate-to-large right pleural effusion, increased from prior
exam. Likely associated atelectasis.
2. Improved left pleural effusion.
3. Bronchial thickening is unchanged.
4. Aortic arch atherosclerosis.

## 2017-04-24 IMAGING — CR DG CHEST 1V PORT
1 series · 1 of 1 positions shown · non-contrast
Comparison: 05/29/2016

CLINICAL DATA: Increased sob, wheezing. Pt came to ER yesterday for
productive cough and fever since Tues. History of DM, HTN, CAD,
COPD, RBBB, cardiomyopathy.

EXAM:
PORTABLE CHEST 1 VIEW

[portable]
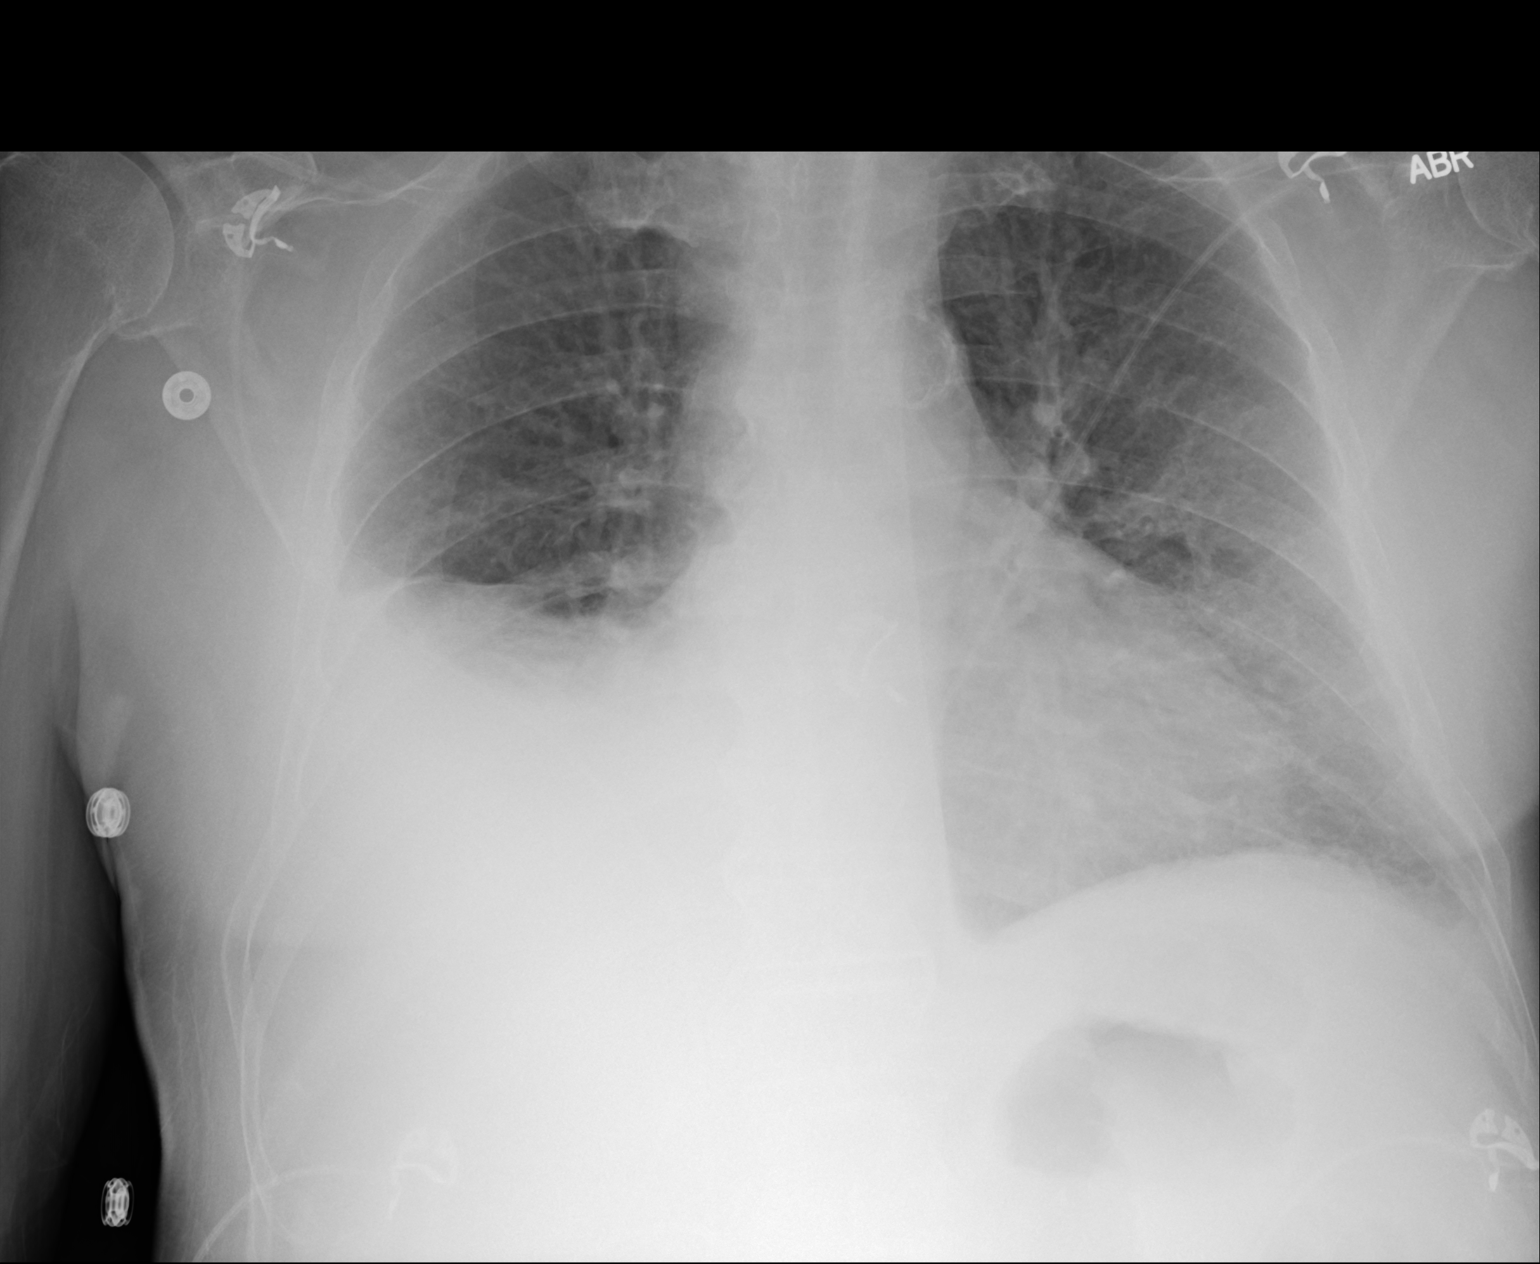

[1 of 1 positions shown; findings below may reference images not displayed]

FINDINGS: Heart size is mildly enlarged. There is persistent right-sided
pleural effusion and right basilar opacity, unchanged over prior
studies. Minimal left lower lobe atelectasis and probable trace left
pleural effusion. No pulmonary edema.
IMPRESSION: Stable appearance of right lower lobe opacity and pleural effusion.
Left lower lobe atelectasis and small effusion.

## 2017-04-24 IMAGING — CT CT CHEST W/O CM
2 of 3 series · 15 of 36 positions shown, 18 images · non-contrast
Comparison: Chest radiograph of May 29, 2016. CT scan of
December 29, 2008.

CLINICAL DATA: Right pleural effusion.

EXAM:
CT CHEST WITHOUT CONTRAST
TECHNIQUE: Multidetector CT imaging of the chest was performed following the
standard protocol without IV contrast.

[Series 2: thorax · axial · 0.68mm/px · z∈[+1328,+1600]mm · 12 of 160 slices shown, 15 images]
[im 12/160  mediastinal]
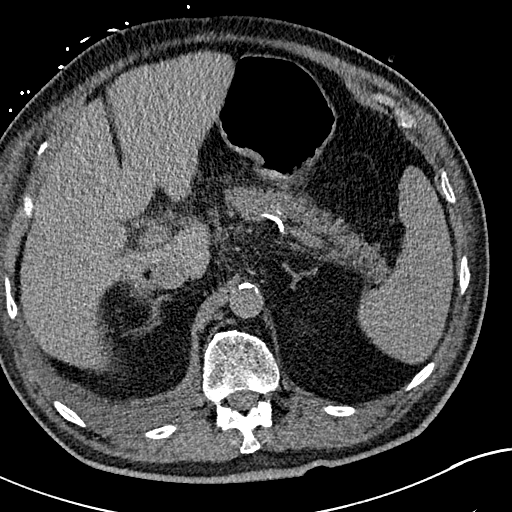
[im 12/160  lung]
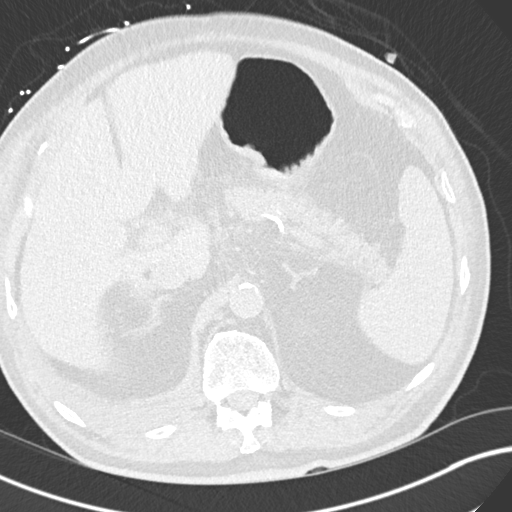
[im 24/160  lung]
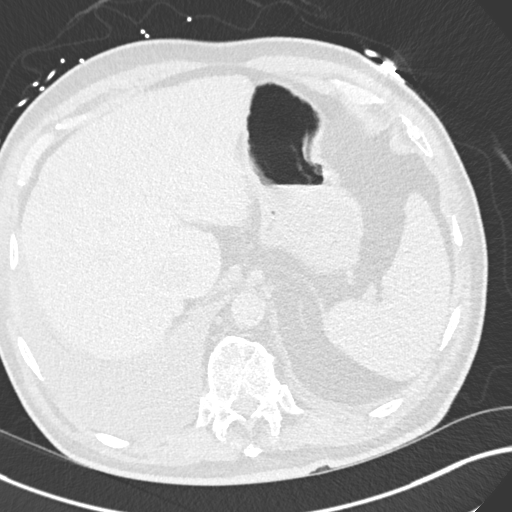
[im 36/160  lung]
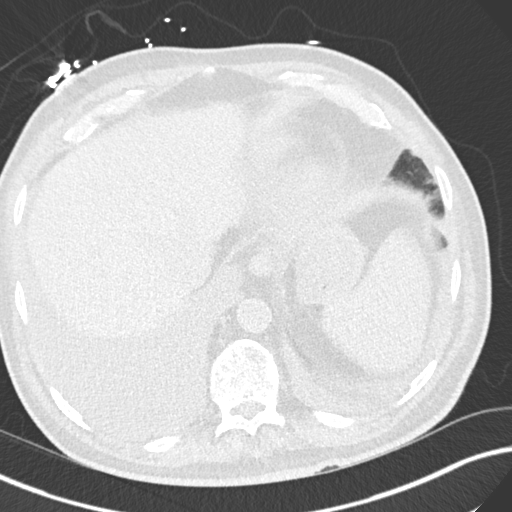
[im 48/160  lung]
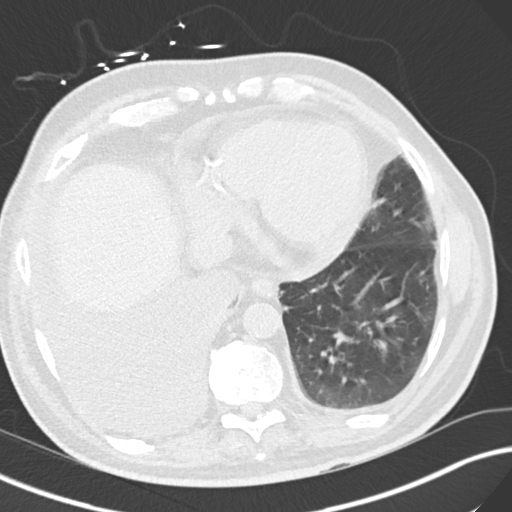
[im 59/160  mediastinal]
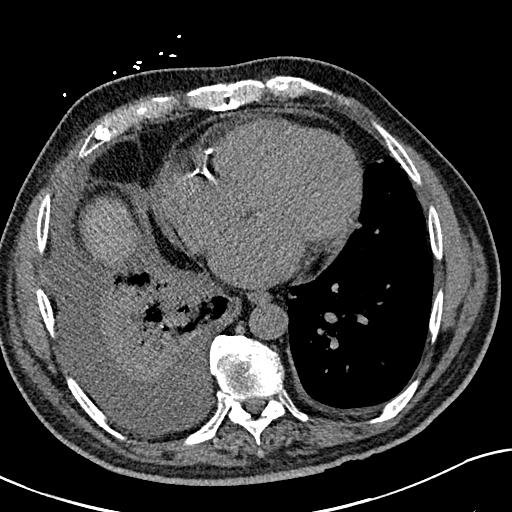
[im 59/160  lung]
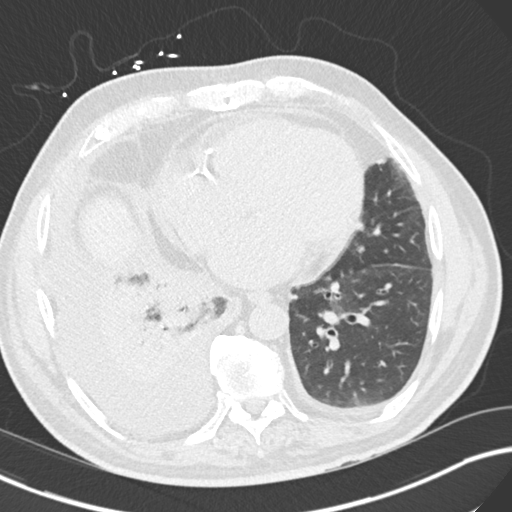
[im 71/160  lung]
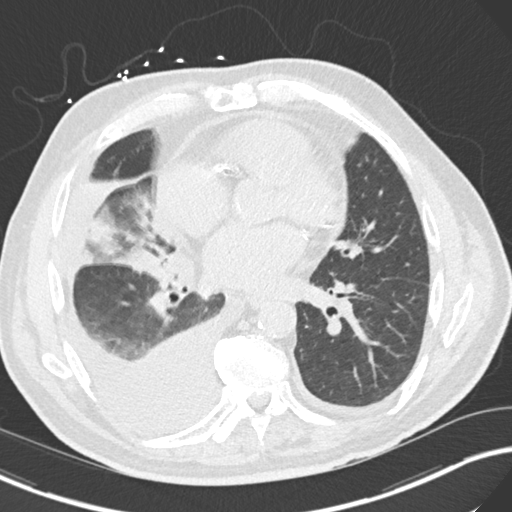
[im 89/160  lung]
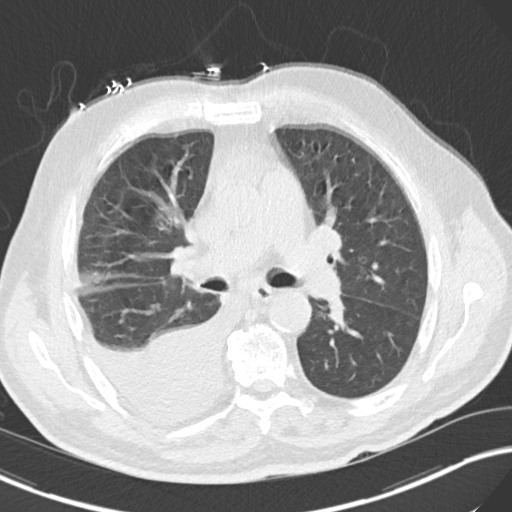
[im 101/160  lung]
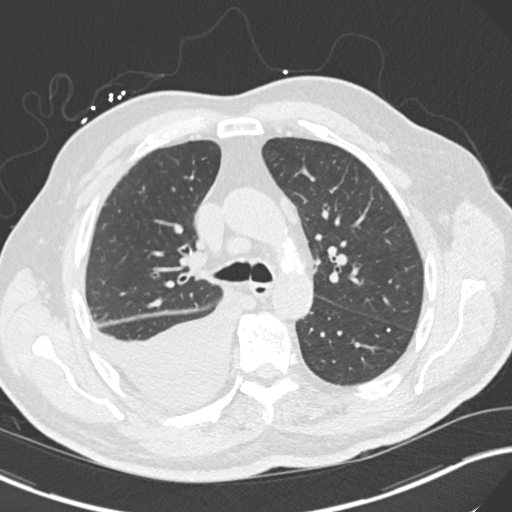
[im 112/160  mediastinal]
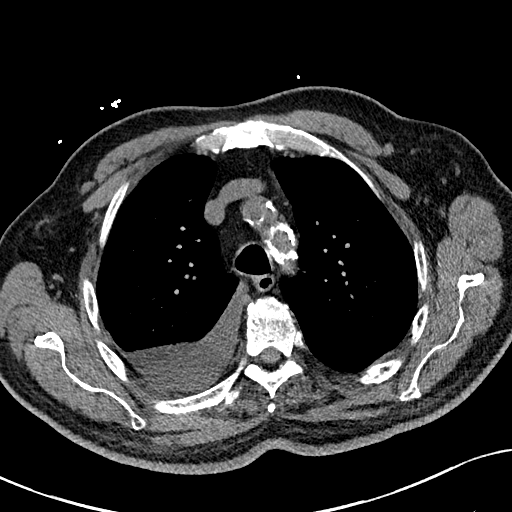
[im 112/160  lung]
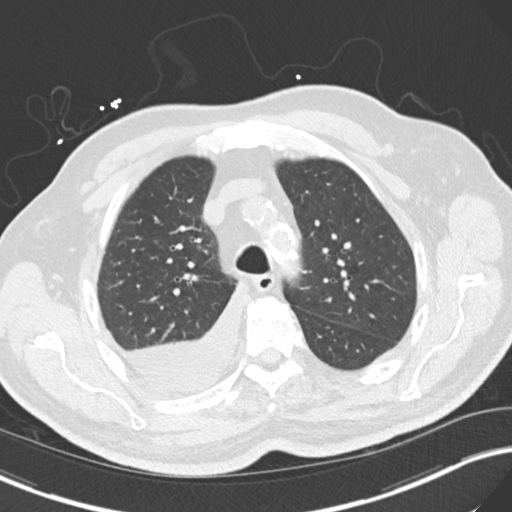
[im 124/160  lung]
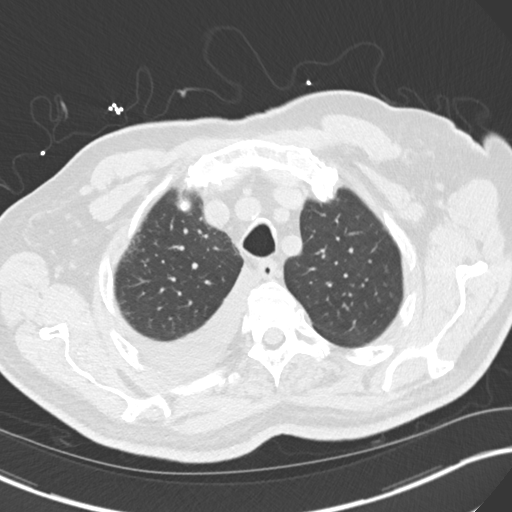
[im 136/160  lung]
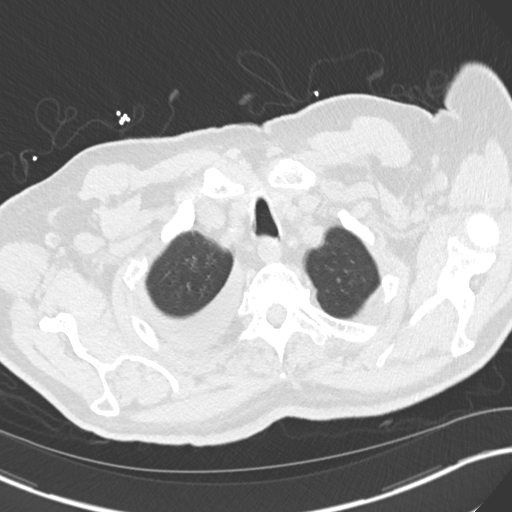
[im 148/160  lung]
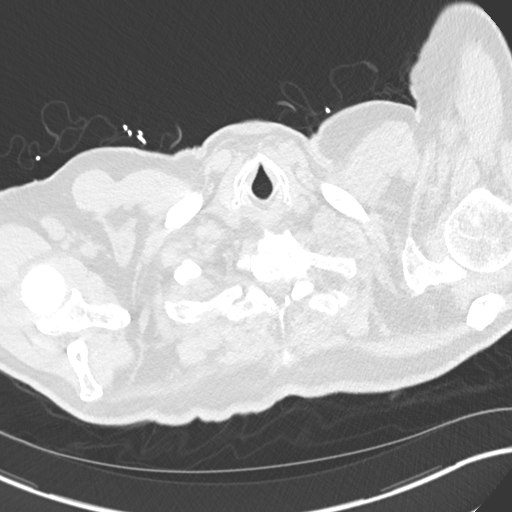

[Series 5: coronal · coronal · 0.64mm/px · 3 of 151 slices shown]
[im 31/151  lung]
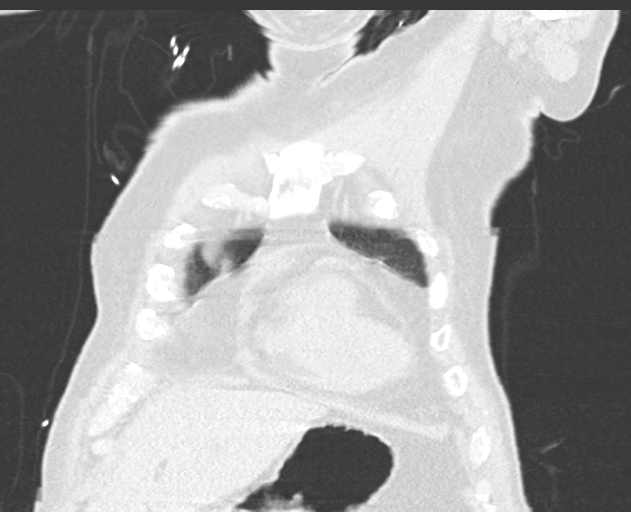
[im 61/151  lung]
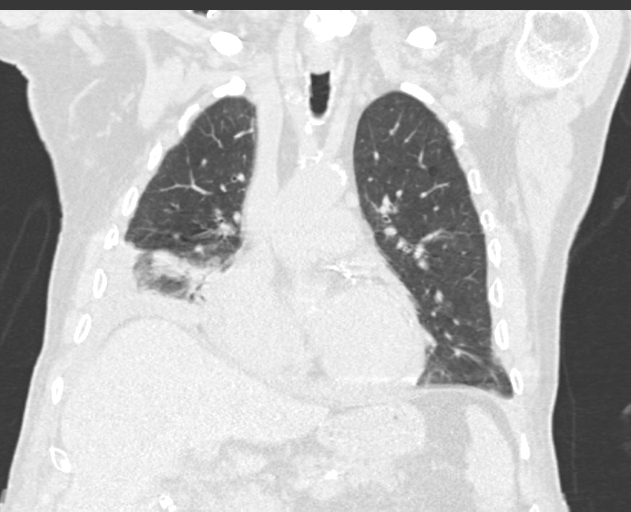
[im 91/151  lung]
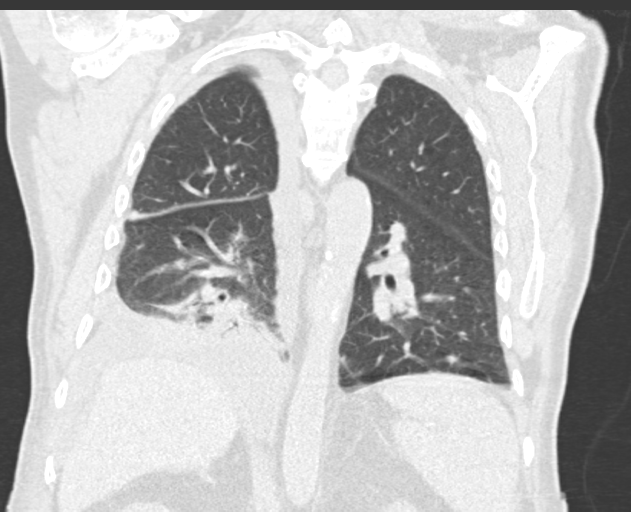

[15 of 36 positions shown; findings below may reference images not displayed]

FINDINGS: Cardiovascular: Atherosclerosis of thoracic aorta is noted without
aneurysm formation. Coronary artery calcifications are noted. Mild
pericardial effusion is noted.

Mediastinum/Nodes: No enlarged mediastinal or axillary lymph nodes.
Thyroid gland, trachea, and esophagus demonstrate no significant
findings.

Lungs/Pleura: No pneumothorax is noted. Left lung is clear. Moderate
right pleural effusion is noted with adjacent atelectasis or
pneumonia of the right lower lobe. Probable right middle lobe
pneumonia is noted as well.

Upper Abdomen: Cholelithiasis. No other abnormality seen in the
visualized portion of upper abdomen.

Musculoskeletal: No chest wall mass or suspicious bone lesions
identified.
IMPRESSION: Aortic atherosclerosis.

Coronary artery calcifications are noted suggesting coronary artery
disease.

Mild pericardial effusion.

Moderate right pleural effusion is noted with adjacent atelectasis
or pneumonia the right lower lobe.

Right middle lobe pneumonia.

Cholelithiasis.

## 2017-05-18 NOTE — Progress Notes (Signed)
Cardiology Office Note  Date: 05/19/2017   ID: Ryan [name], DOB [birth year], MRN [id number]  PCP: Kirstie Peri, MD  Primary Cardiologist: Nona Dell, MD   Chief Complaint  Patient presents with  . Diastolic heart failure    History of Present Illness: Ryan [name] is an 84 y.o. male last seen in August 2018. He presents for a routine follow-up visit. Reports no significant leg swelling or increasing shortness of breath on current regimen. His weight has also been stable. He does not indicate any chest discomfort, lightheadedness, or syncope.  I reviewed his current cardiac medications which include aspirin, Norvasc, Lasix, Cozaar, Toprol-XL, potassium supplements, and Zocor. Echocardiogram from April 2018 revealed LVEF 60-65%.  He has declined anticoagulation and continues on aspirin. CHADSVASC score is 5.  Past Medical History:  Diagnosis Date  . Allergic rhinitis   . CAD (coronary artery disease)    Mild nonobstructive disease 2005  . Calculus of kidney   . Colon polyp   . COPD (chronic obstructive pulmonary disease) (HCC)   . Dyslipidemia   . Essential hypertension   . Fatty liver   . History of cardiomyopathy    Normalization of LVEF on medical therapy  . History of prostatitis   . Osteoarthritis   . Right bundle branch block   . Type 2 diabetes mellitus (HCC)     Past Surgical History:  Procedure Laterality Date  . COLONOSCOPY    . HEMICOLECTOMY      Current Outpatient Medications  Medication Sig Dispense Refill  . amLODipine (NORVASC) 5 MG tablet Take 1 tablet (5 mg total) by mouth daily. 90 tablet 3  . aspirin 81 MG tablet Take 81 mg by mouth every evening.    . diphenhydramine-acetaminophen (TYLENOL PM) 25-500 MG TABS tablet Take 1 tablet by mouth at bedtime as needed.    . furosemide (LASIX) 40 MG tablet Take 2 tablets (80 mg total) by mouth daily. 180 tablet 3  . losartan (COZAAR) 25 MG tablet Take 1 tablet (25 mg total) by mouth daily. 90 tablet  3  . metFORMIN (GLUCOPHAGE) 500 MG tablet Take 500 mg by mouth 3 (three) times daily.     . metoprolol succinate (TOPROL-XL) 50 MG 24 hr tablet Take 1 tablet by mouth daily.    . Omega-3 Fatty Acids (FISH OIL) 1000 MG CAPS Take 1 capsule by mouth 2 (two) times daily.     . potassium chloride (KLOR-CON 10) 10 MEQ tablet Take 10 mEq by mouth 2 (two) times daily.    . simvastatin (ZOCOR) 40 MG tablet Take 40 mg by mouth every evening.    . Tamsulosin HCl (FLOMAX) 0.4 MG CAPS Take 0.4 mg by mouth every morning.     . vitamin B-12 (CYANOCOBALAMIN) 500 MCG tablet Take 500 mcg by mouth daily.     No current facility-administered medications for this visit.    Allergies:  Patient has no known allergies.   Social History: The patient  reports that he quit smoking about 32 years ago. His smoking use included cigarettes. He started smoking about 64 years ago. He has a 80.00 pack-year smoking history. he has never used smokeless tobacco. He reports that he does not drink alcohol.   ROS:  Please see the history of present illness. Otherwise, complete review of systems is positive for memory and hearing loss.  All other systems are reviewed and negative.   Physical Exam: VS:  BP 120/64   Pulse (!) 50  Ht 6' (1.829 m)   Wt 189 lb (85.7 kg)   SpO2 97%   BMI 25.63 kg/m , BMI Body mass index is 25.63 kg/m.  Wt Readings from Last 3 Encounters:  05/19/17 189 lb (85.7 kg)  12/16/16 190 lb 3.2 oz (86.3 kg)  09/15/16 186 lb 9.6 oz (84.6 kg)    General: Elderly male, appears comfortable at rest. HEENT: Conjunctiva and lids normal, oropharynx clear. Neck: Supple, no elevated JVP or carotid bruits, no thyromegaly. Lungs: Diminished breath sounds without wheezing, nonlabored breathing at rest. Cardiac: Irregular, no S3, soft systolic murmur. Abdomen: Soft, nontender, bowel sounds present. Extremities: No pitting edema, distal pulses 2+. Skin: Warm and dry. Musculoskeletal: No  kyphosis. Neuropsychiatric: Alert and oriented x3, affect grossly appropriate.  ECG: I personally reviewed the tracing from 12/16/2016 which showed coarse atrial fibrillation/flutter with) branch block.  Recent Labwork: 05/29/2016: B Natriuretic Peptide 166.0 05/30/2016: ALT 26; AST 58; Hemoglobin 11.6; Platelets 84 09/12/2016: BUN 33; Creat 1.91; Potassium 4.4; Sodium 143   Other Studies Reviewed Today:  Echocardiogram 08/18/2016: Study Conclusions  - Left ventricle: The cavity size was normal. Wall thickness was at   the upper limits of normal. Systolic function was normal. The   estimated ejection fraction was in the range of 60% to 65%. Wall   motion was normal; there were no regional wall motion   abnormalities. The study is not technically sufficient to allow   evaluation of LV diastolic function. - Aortic valve: Mildly calcified annulus. Trileaflet; mildly   calcified leaflets. - Mitral valve: Calcified annulus. There was trivial regurgitation. - Right atrium: The atrium was mildly dilated. Central venous   pressure (est): 8 mm Hg. - Tricuspid valve: There was mild regurgitation. - Pulmonary arteries: PA peak pressure: 46 mm Hg (S). - Pericardium, extracardiac: A small pericardial effusion was   identified circumferential to the heart with small to moderate   collection posteriorly.  Impressions:  - Upper normal LV wall thickness with LVEF 60-65%. Indeterminate   diastolic function. Mildly calcified mitral annulus with trivial   mitral regurgitation. Mildly sclerotic aortic valve. Mild   tricuspid regurgitation with PASP estimated 46 mmHg. Small   circumferential pericardial effusion noted with small to moderate   collection posteriorly.  Assessment and Plan:  1. Chronic diastolic heart failure, doing well in terms of fluid status with stable weight, no leg edema. Plan to continue with current diuretic regimen.  2. Persistent atrial fibrillation/flutter. Heart rate is  controlled on Toprol-XL. He declines anticoagulation and continues on aspirin.  3. Essential hypertension, blood pressure is well controlled today.  4. CKD stage III.  Current medicines were reviewed with the patient today.  Disposition: Follow-up in 6 months.  Signed, Jonelle Sidle, MD, [facility] 05/19/2017 1:55 PM    [facility] [address] [place], [place], [place] [address] Phone: [phone]; Fax: [phone]

## 2017-05-19 ENCOUNTER — Ambulatory Visit (INDEPENDENT_AMBULATORY_CARE_PROVIDER_SITE_OTHER): Payer: Medicare Other | Admitting: Cardiology

## 2017-05-19 ENCOUNTER — Encounter: Payer: Self-pay | Admitting: Cardiology

## 2017-05-19 VITALS — BP 120/64 | HR 50 | Ht 72.0 in | Wt 189.0 lb

## 2017-05-19 DIAGNOSIS — I10 Essential (primary) hypertension: Secondary | ICD-10-CM

## 2017-05-19 DIAGNOSIS — I481 Persistent atrial fibrillation: Secondary | ICD-10-CM | POA: Diagnosis not present

## 2017-05-19 DIAGNOSIS — N183 Chronic kidney disease, stage 3 unspecified: Secondary | ICD-10-CM

## 2017-05-19 DIAGNOSIS — I4819 Other persistent atrial fibrillation: Secondary | ICD-10-CM

## 2017-05-19 DIAGNOSIS — I5032 Chronic diastolic (congestive) heart failure: Secondary | ICD-10-CM

## 2017-05-19 NOTE — Patient Instructions (Signed)

## 2017-06-07 IMAGING — US US RENAL
1 series · 14 of 25 positions shown · non-contrast
Comparison: None in PACs

CLINICAL DATA: Urinary tract infection. History of diabetes,
coronary artery disease.

EXAM:
RENAL / URINARY TRACT ULTRASOUND COMPLETE

[Series 1: us renal · 0.24mm/px · 14 of 76 slices shown]
[im 1/76]
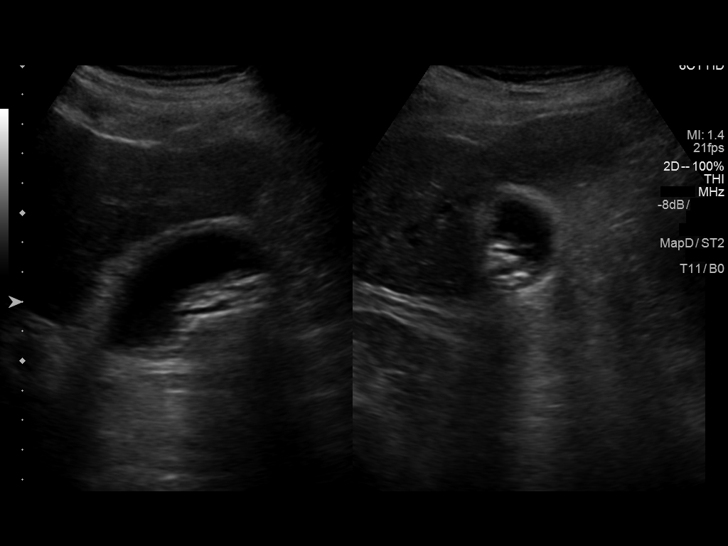
[im 7/76]
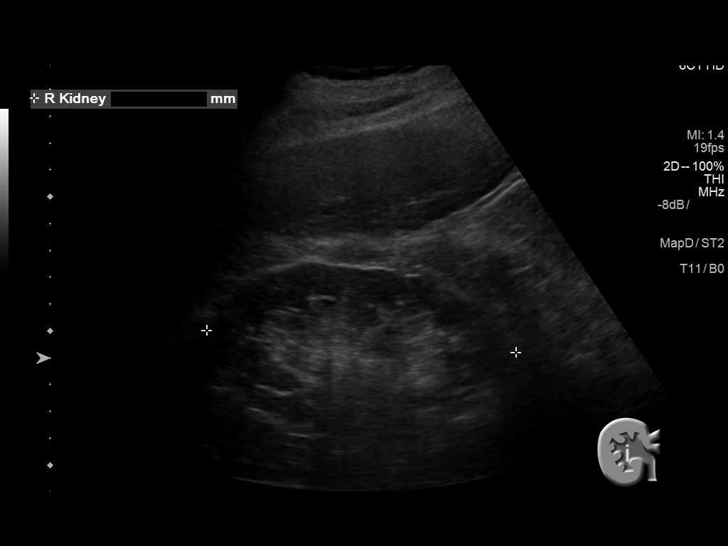
[im 13/76]
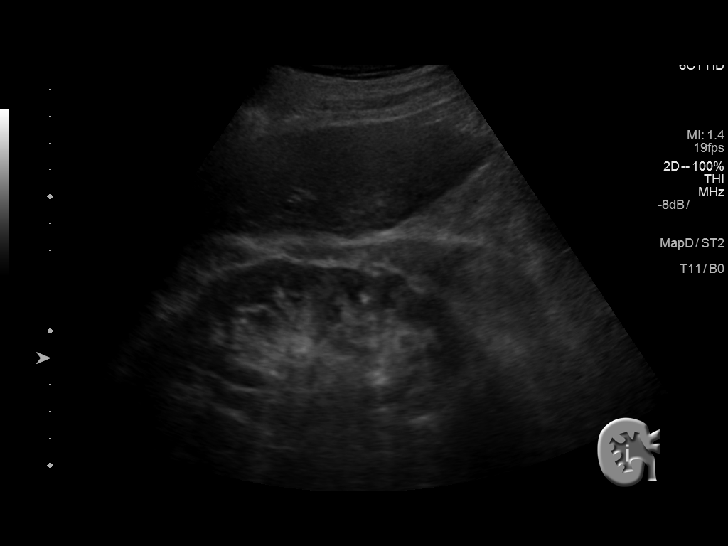
[im 19/76]
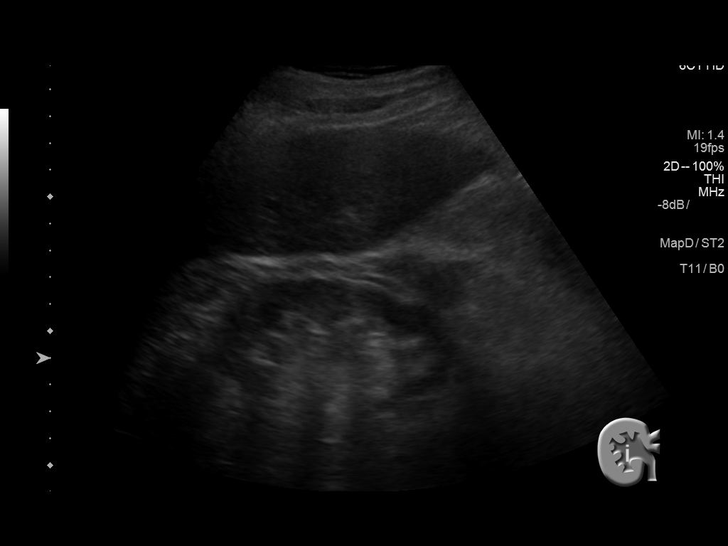
[im 26/76]
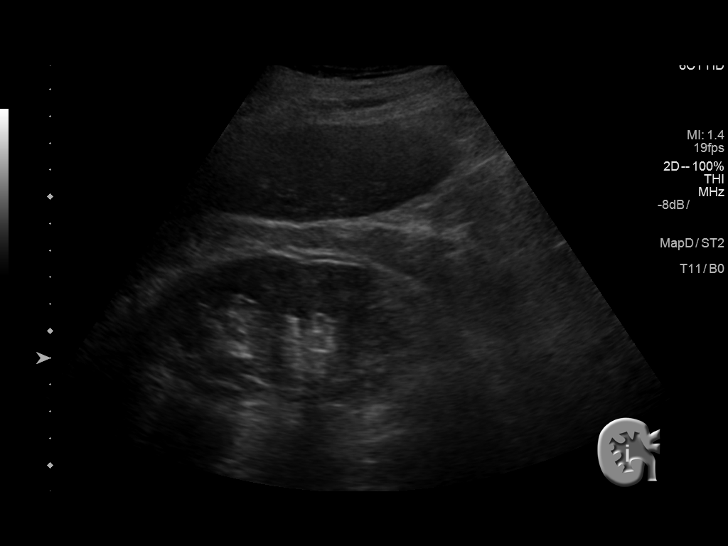
[im 29/76]
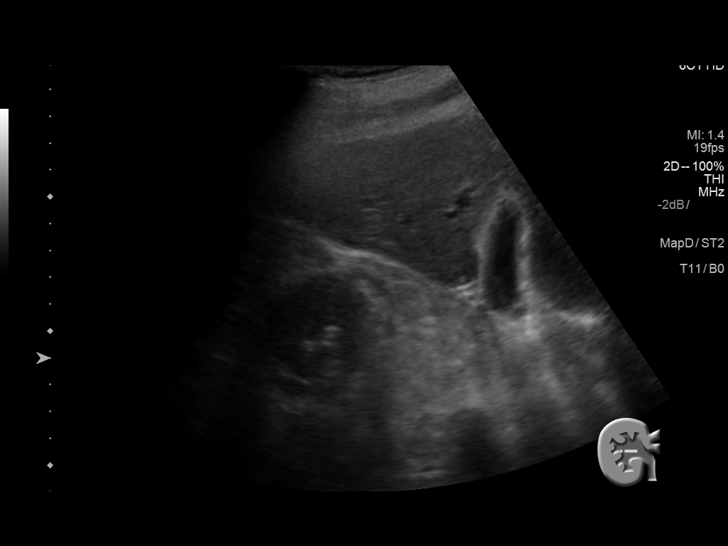
[im 35/76]
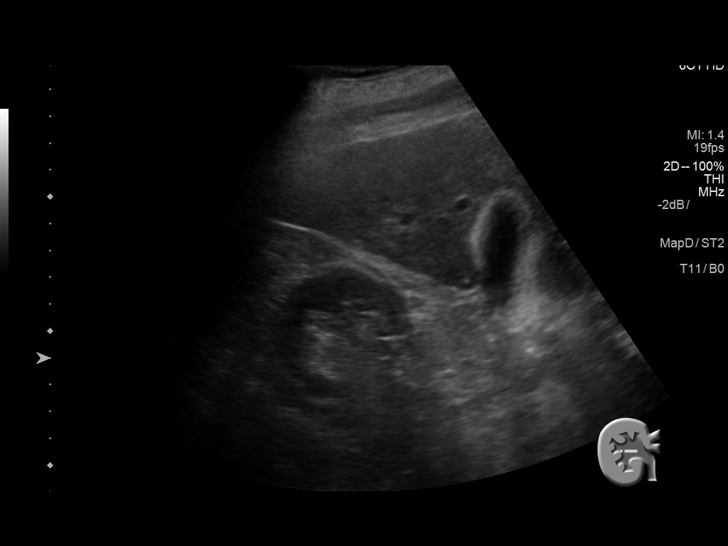
[im 41/76]
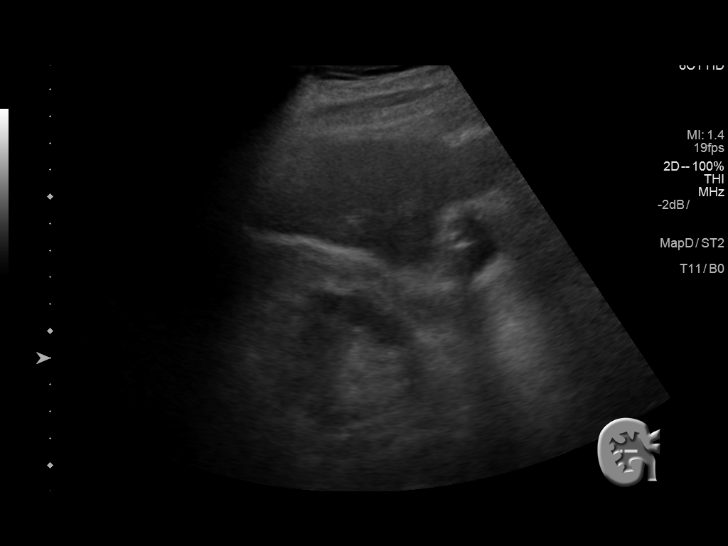
[im 47/76]
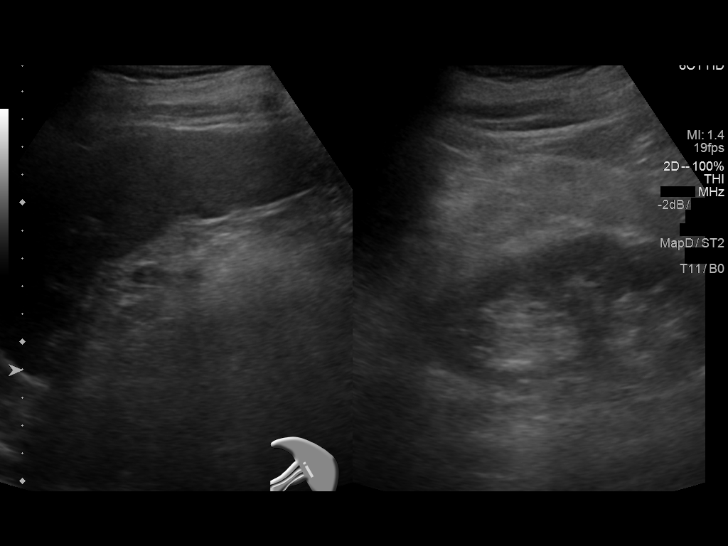
[im 51/76]
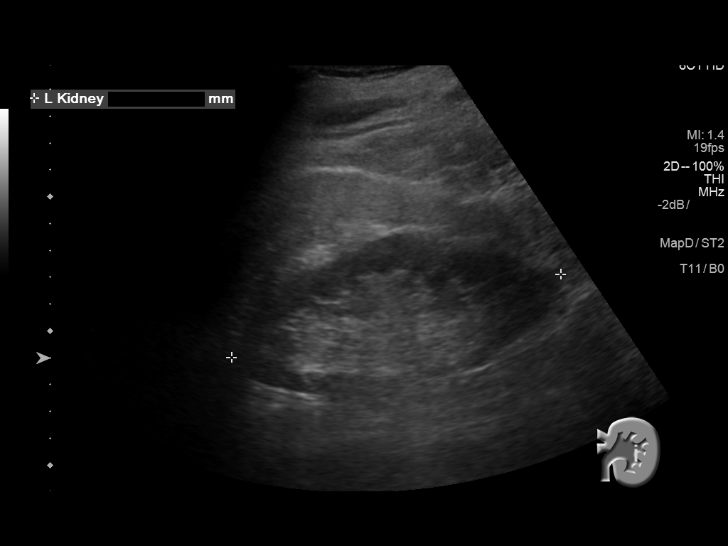
[im 57/76]
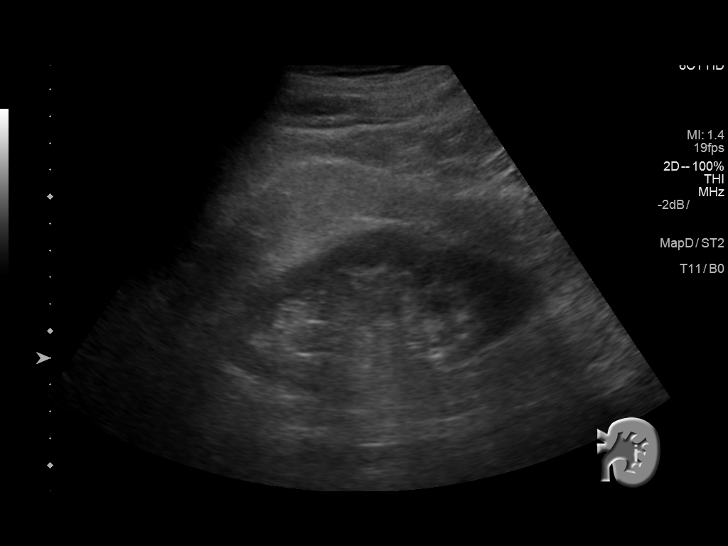
[im 63/76]
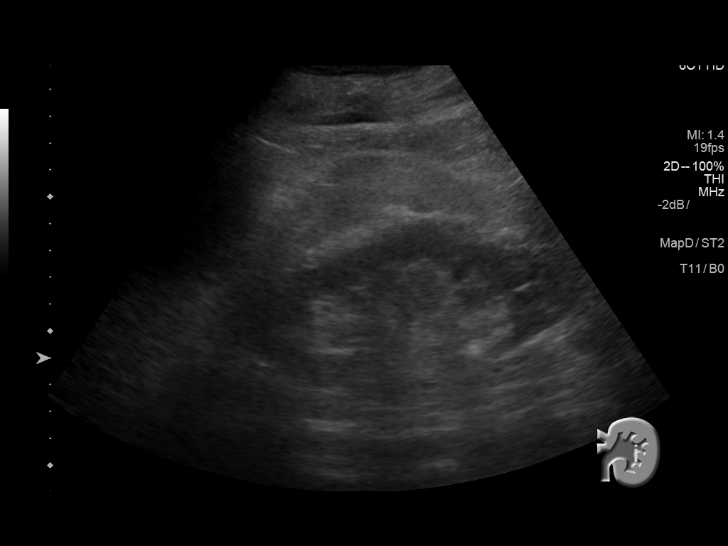
[im 69/76]
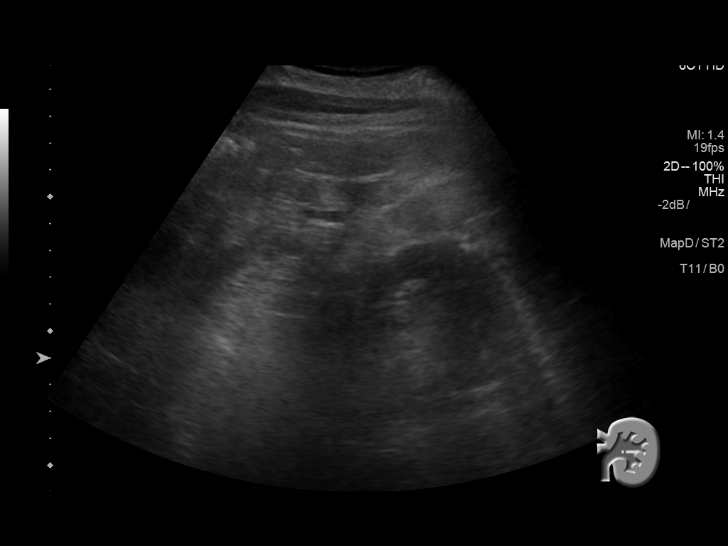
[im 76/76]
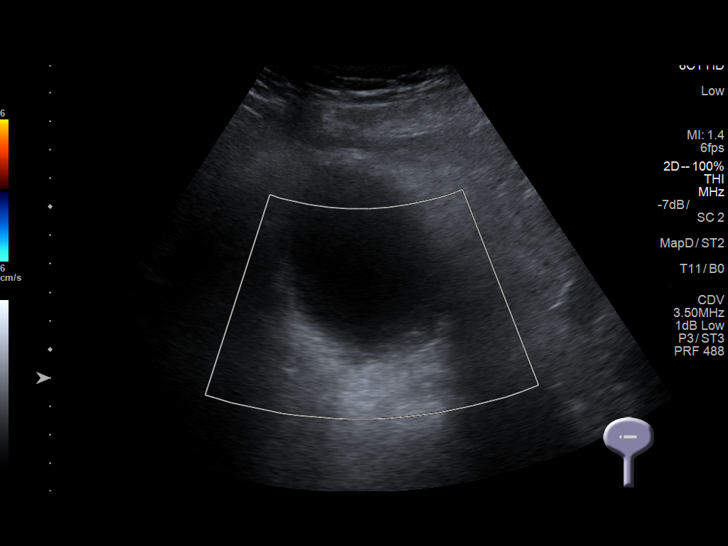

[14 of 25 positions shown; findings below may reference images not displayed]

FINDINGS: Right Kidney:

Length: 11.7 cm. The renal cortical echotexture is lower than that
of the adjacent liver. There is mild diffuse cortical thinning.
There is no hydronephrosis.

Left Kidney:

Length: 12.7 cm. The renal cortical echotexture is similar to that
on the right. There is mild cortical thinning.

Bladder:

The partially distended urinary bladder is unremarkable. Ureteral
jets were not observed.

Incidental note is made of gallstones. No positive sonographic
Murphy sign was reported.
IMPRESSION: Mild renal cortical atrophy bilaterally. Preserved renal cortical
echotexture. No hydronephrosis.

Gallstones.

## 2017-07-10 IMAGING — DX DG CHEST 2V
2 series · 2 of 2 positions shown · non-contrast
Comparison: 05/30/2016

CLINICAL DATA: [DATE] month history of productive cough and shortness
of breath.

EXAM:
CHEST  2 VIEW

[chest pa]
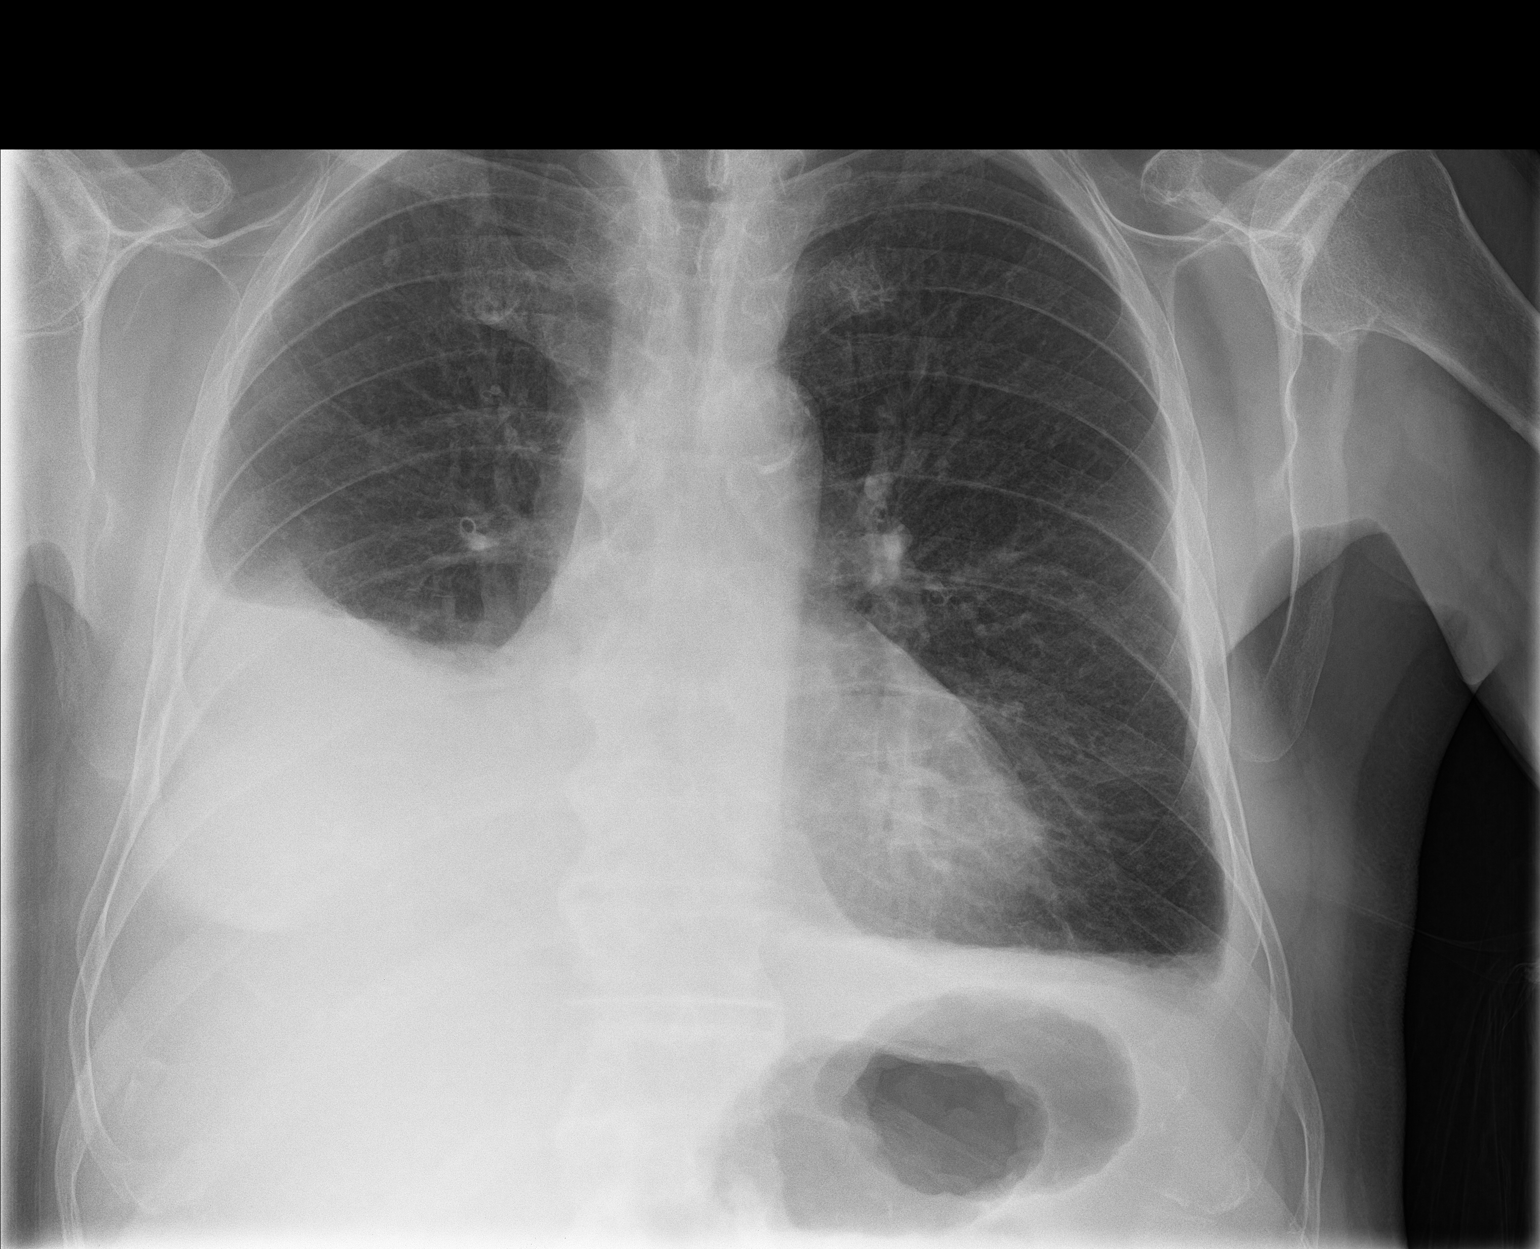

[chest lat]
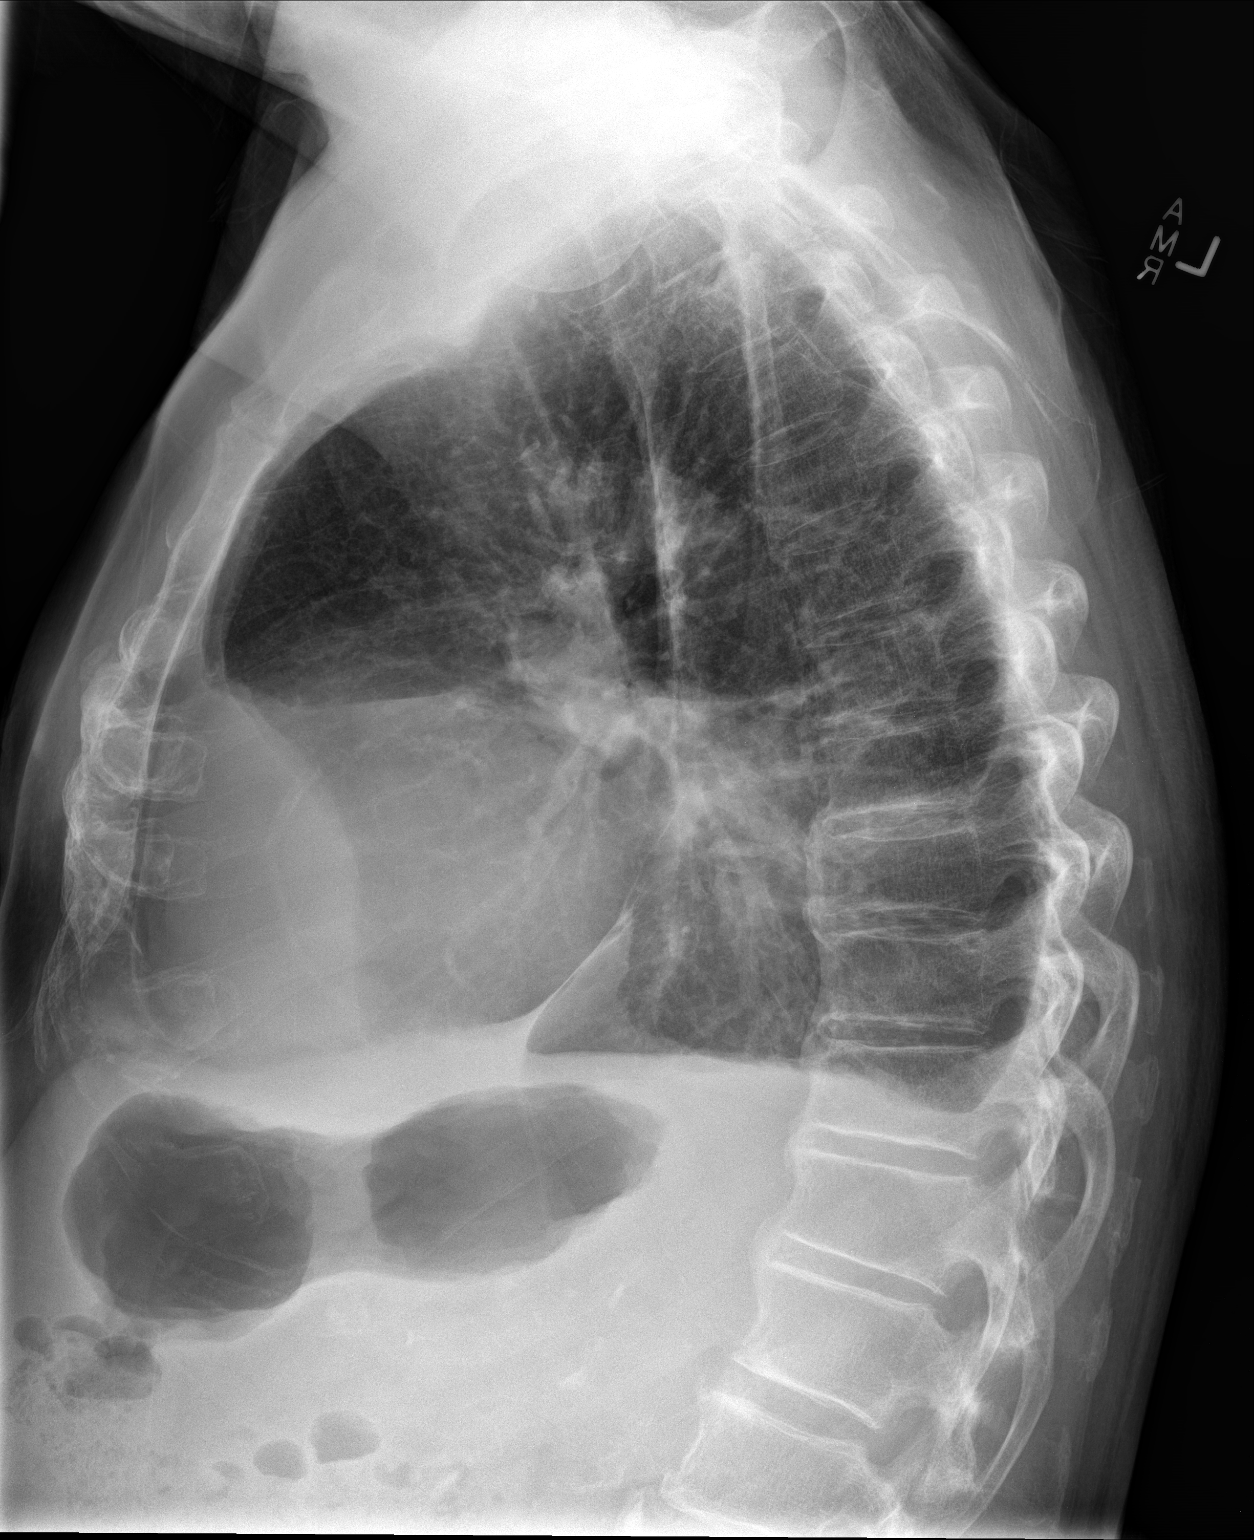

[2 of 2 positions shown; findings below may reference images not displayed]

FINDINGS: The heart is normal in size and stable. Stable tortuosity and
calcification of the thoracic aorta. There is a persistent
moderate-sized right pleural effusion and a small left pleural
effusion. No pulmonary edema. No definite infiltrates. There is
atelectasis overlying the effusions. No worrisome pulmonary lesions.
The bony thorax is intact.
IMPRESSION: Persistent bilateral pleural effusions, right greater than left with
overlying atelectasis.

No edema, definite infiltrates or pulmonary lesions.

## 2017-07-23 IMAGING — DX DG CHEST 2V
2 series · 2 of 2 positions shown · non-contrast
Comparison: 08/15/16

CLINICAL DATA: Follow-up pleural effusion

EXAM:
CHEST  2 VIEW

[chest pa]
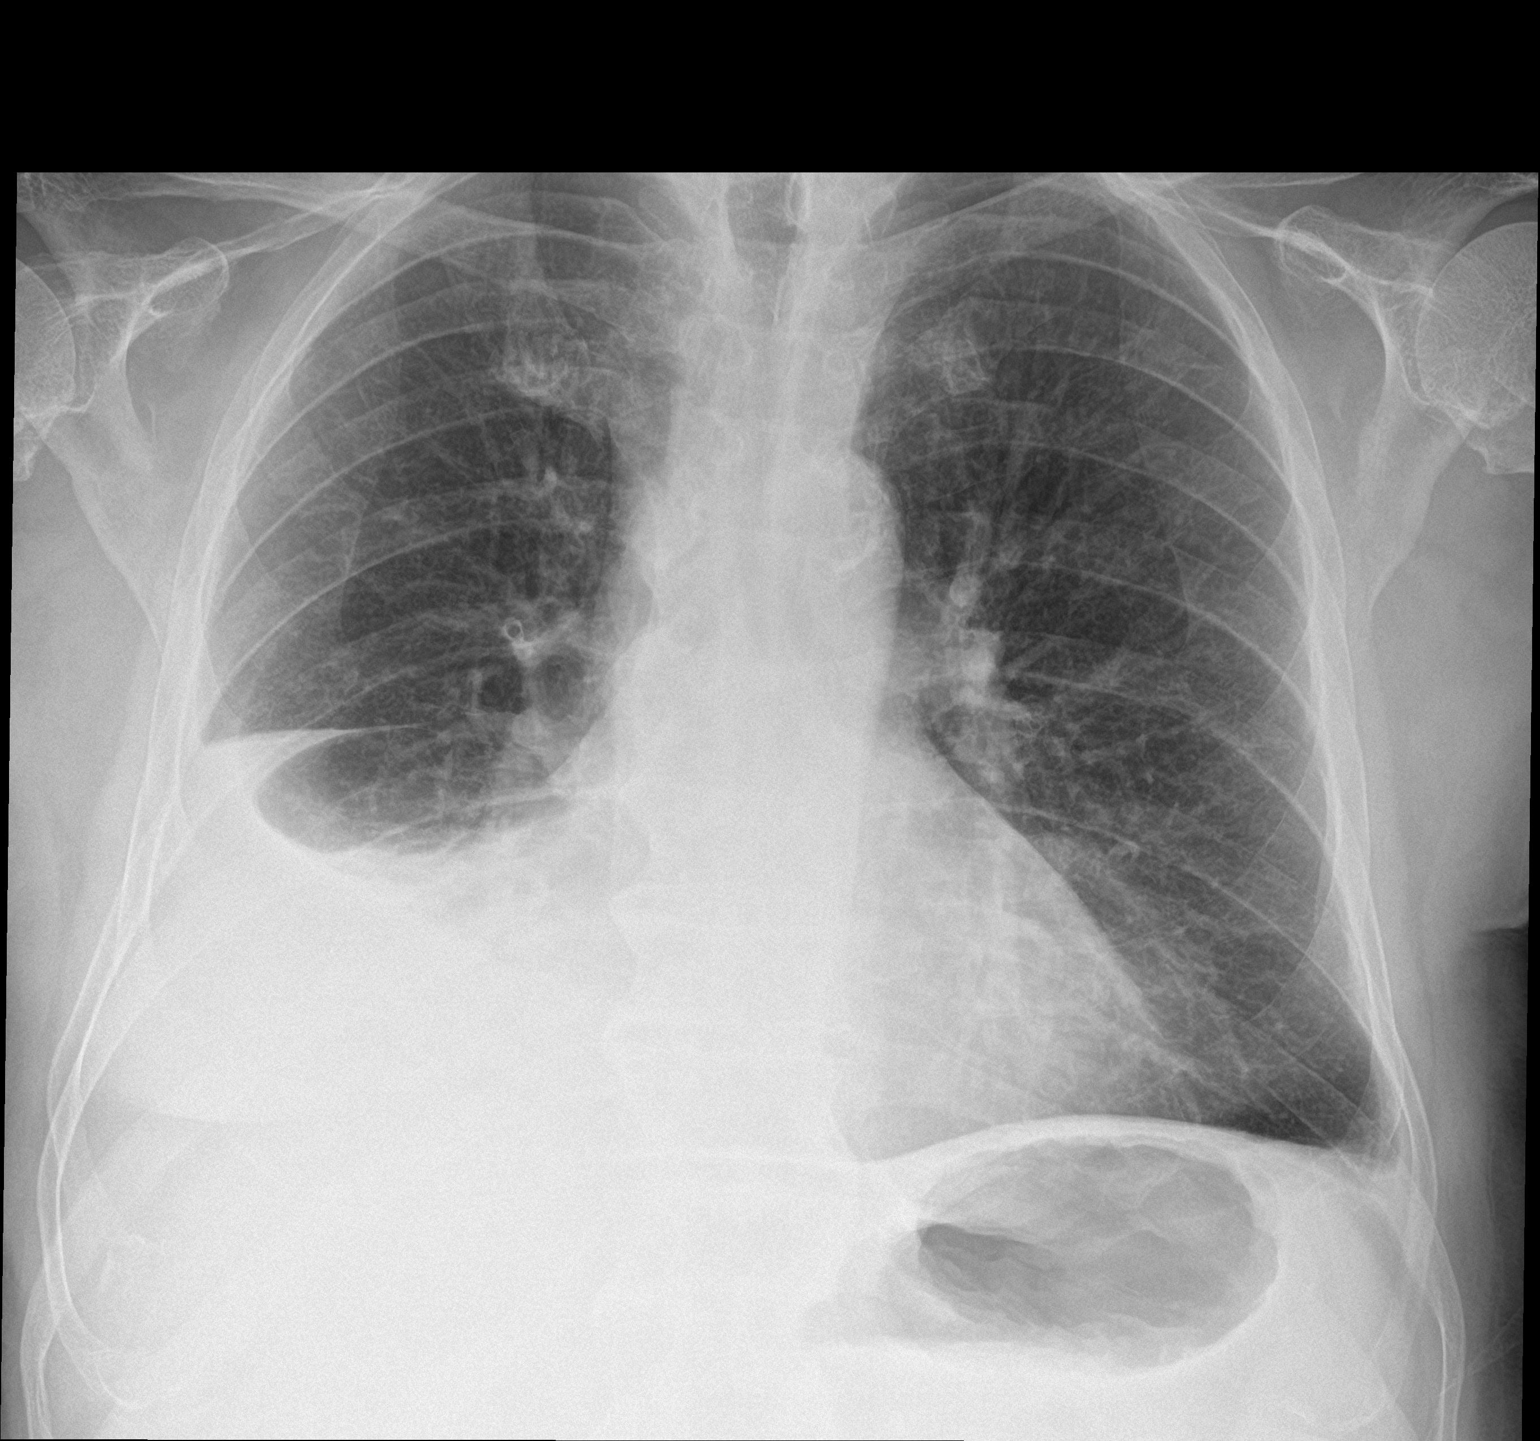

[chest lat]
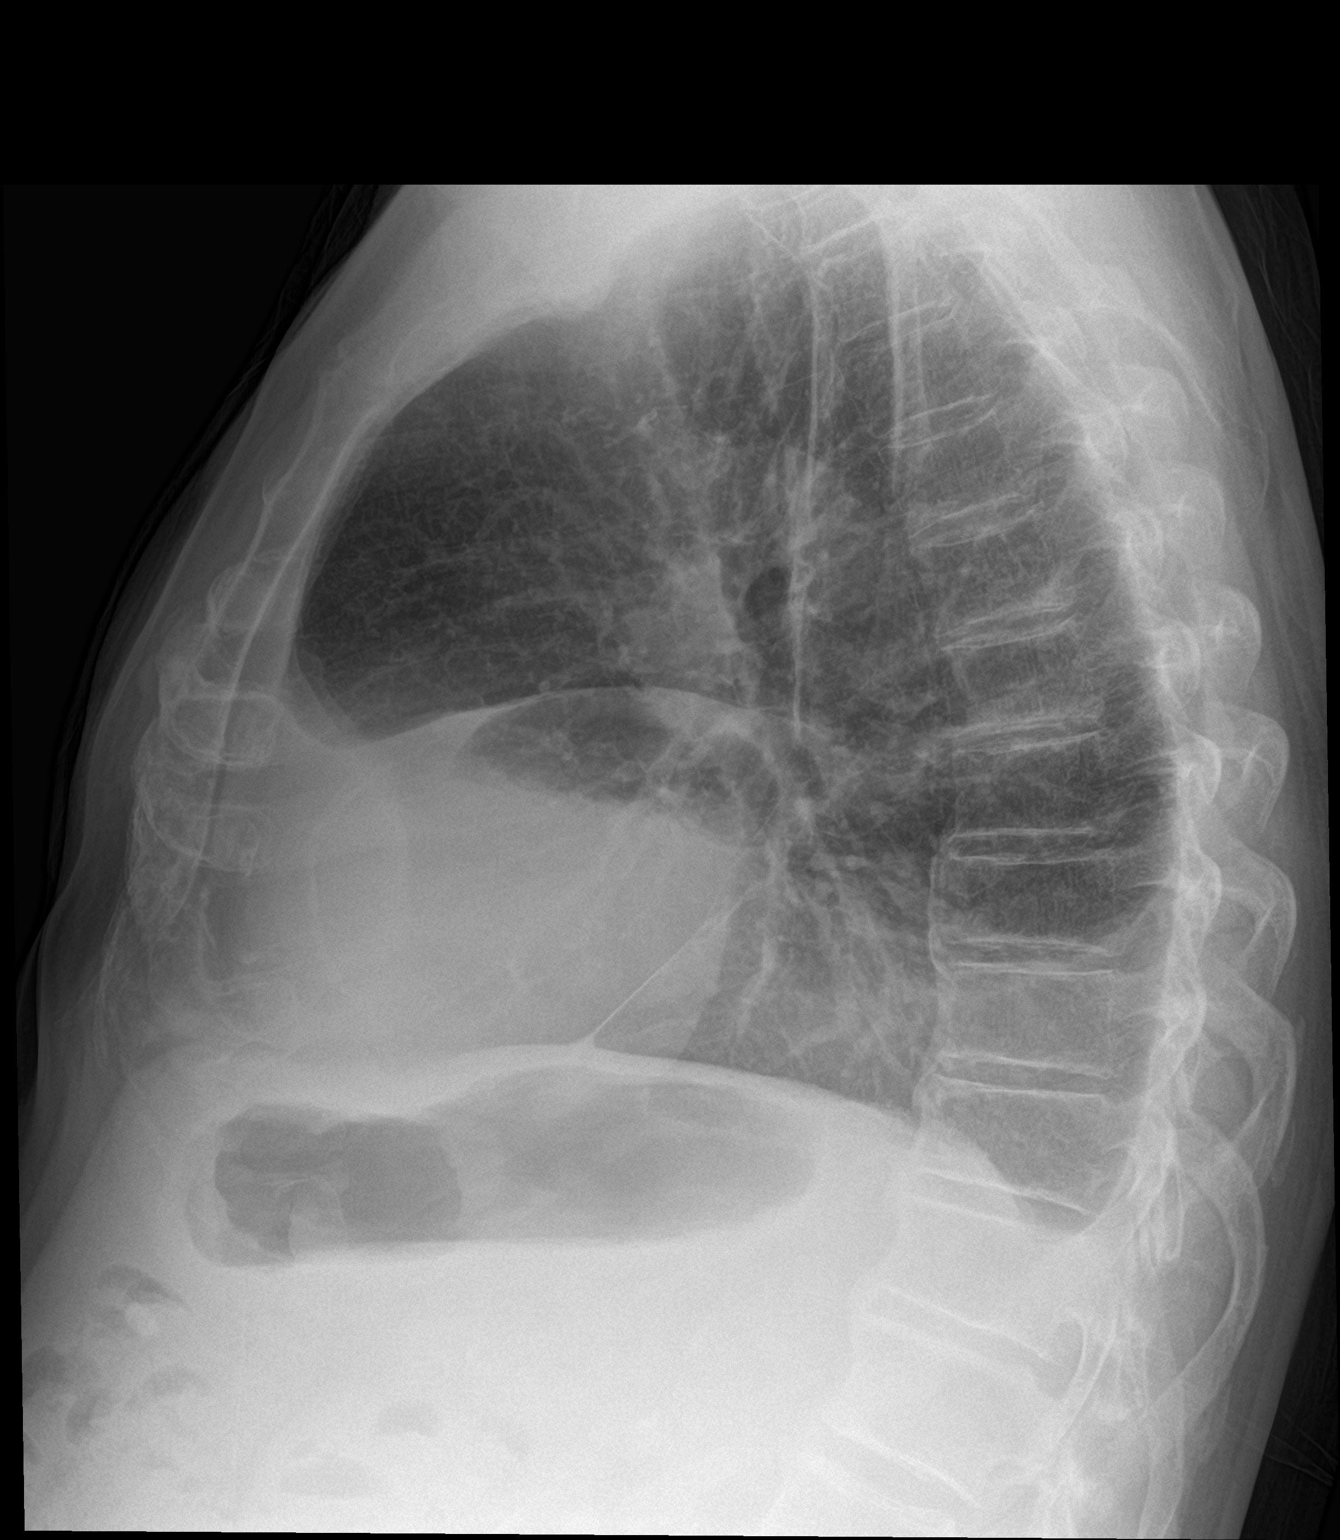

[2 of 2 positions shown; findings below may reference images not displayed]

FINDINGS: Cardiac shadow is enlarged but stable. The left lung is well aerated
without focal abnormality. Right-sided pleural effusion is noted but
slightly decreased when compared with the prior exam. Underlying
atelectatic changes are present. No bony abnormality is seen.
IMPRESSION: Slight improvement in right-sided pleural effusion.

## 2017-11-26 DEATH — deceased
